# Patient Record
Sex: Male | Born: 1965 | Race: White | Hispanic: No | Marital: Married | Smoking: Never smoker
Health system: Southern US, Community
[De-identification: ages and names within clinical notes are randomized; demographics above are authoritative.]

## PROBLEM LIST (undated history)

## (undated) DIAGNOSIS — D689 Coagulation defect, unspecified: Secondary | ICD-10-CM

## (undated) DIAGNOSIS — K219 Gastro-esophageal reflux disease without esophagitis: Secondary | ICD-10-CM

## (undated) DIAGNOSIS — J309 Allergic rhinitis, unspecified: Secondary | ICD-10-CM

## (undated) DIAGNOSIS — E785 Hyperlipidemia, unspecified: Secondary | ICD-10-CM

## (undated) DIAGNOSIS — F32A Depression, unspecified: Secondary | ICD-10-CM

## (undated) DIAGNOSIS — T7840XA Allergy, unspecified, initial encounter: Secondary | ICD-10-CM

## (undated) DIAGNOSIS — F329 Major depressive disorder, single episode, unspecified: Secondary | ICD-10-CM

## (undated) HISTORY — PX: SPLENECTOMY: SUR1306

## (undated) HISTORY — DX: Allergy, unspecified, initial encounter: T78.40XA

## (undated) HISTORY — DX: Allergic rhinitis, unspecified: J30.9

## (undated) HISTORY — DX: Coagulation defect, unspecified: D68.9

## (undated) HISTORY — DX: Depression, unspecified: F32.A

## (undated) HISTORY — DX: Major depressive disorder, single episode, unspecified: F32.9

---

## 1999-02-28 ENCOUNTER — Other Ambulatory Visit: Admission: RE | Admit: 1999-02-28 | Discharge: 1999-02-28 | Payer: Self-pay | Admitting: Oncology

## 1999-03-14 ENCOUNTER — Ambulatory Visit (HOSPITAL_COMMUNITY): Admission: RE | Admit: 1999-03-14 | Discharge: 1999-03-14 | Payer: Self-pay | Admitting: Oncology

## 2005-12-04 ENCOUNTER — Encounter: Admission: RE | Admit: 2005-12-04 | Discharge: 2005-12-04 | Payer: Self-pay | Admitting: Occupational Medicine

## 2008-12-11 ENCOUNTER — Ambulatory Visit: Payer: Self-pay | Admitting: Cardiology

## 2008-12-13 ENCOUNTER — Inpatient Hospital Stay (HOSPITAL_BASED_OUTPATIENT_CLINIC_OR_DEPARTMENT_OTHER): Admission: RE | Admit: 2008-12-13 | Discharge: 2008-12-13 | Payer: Self-pay | Admitting: Cardiovascular Disease

## 2008-12-13 ENCOUNTER — Ambulatory Visit: Payer: Self-pay | Admitting: Cardiovascular Disease

## 2011-03-26 NOTE — Cardiovascular Report (Signed)
NAMERIGHTEOUS, CLAIBORNE NO.:  [id number]   MEDICAL RECORD NO.:  [id number]          PATIENT TYPE:  OIB   LOCATION:  1963                         FACILITY:  [facility]   PHYSICIAN:  Verne Carrow, MDDATE OF BIRTH:  02/14/1966   DATE OF PROCEDURE:  12/13/2008  DATE OF DISCHARGE:  12/13/2008                            CARDIAC CATHETERIZATION   PRIMARY CARDIOLOGIST:  Learta Codding, MD, [facility]   PROCEDURE PERFORMED:  1. Left heart catheterization.  2. Selective coronary angiography.  3. Left ventricular angiogram.   OPERATOR:  Verne Carrow, MD   INDICATIONS:  This is a 45 year old Caucasian male with a history of  hyperlipidemia, smokeless tobacco use, and a strong family history of  coronary artery disease was admitted to [facility] with  complaints of chest pain, palpitations, and lightheadedness.  He ruled  out for myocardial infarction with serial cardiac enzymes.  However, a  nuclear stress test performed on December 12, 2008, was suggestive of an  inferior wall defect and ventricular dilatation with exercise that could  be suggestive of multivessel balanced ischemia.  The patient was  referred for an outpatient left heart catheterization today.   DETAILS OF PROCEDURE:  The patient was brought into the outpatient  catheterization laboratory after signing informed consent for the  procedure.  The right groin was prepped and draped in sterile fashion.  Lidocaine 1% was used for local anesthesia.  A 4-French sheath was  inserted into the right femoral artery without difficulty.  A JL-4  diagnostic catheter was used to selectively engage and inject the left  coronary system.  A 3-DRC diagnostic catheter was used to selectively  engage and inject the right coronary artery.  A 4-French pigtail  catheter was used to cross the aortic valve into the left ventricle.  Following performance of the left ventricular angiogram, the pigtail  catheter was pulled  back across the aortic valve with no significant  pressure gradient measured.  The patient tolerated the procedure well.   HEMODYNAMIC FINDINGS:  Central aortic pressure 129/83.  Left ventricular  pressure 133/12.  End-diastolic pressure 18.   ANGIOGRAPHIC FINDINGS:  1. The left main coronary artery bifurcates into the circumflex and      LAD and has no angiographic evidence of disease.  2. Left anterior descending is a large vessel that courses to the apex      and gives off 2 diagonal branches.  There is no angiographic      evidence of disease in the LAD system.  3. Circumflex artery has a moderate-sized first obtuse marginal branch      and a small posterolateral branch that is more distal.  There is no      angiographic evidence of disease in the circumflex.  4. The right coronary artery is a large dominant vessel that gives off      a posterior descending artery and a posterolateral branch.  There      is no angiographic evidence of disease in the right coronary      artery.  5. Left ventricular angiogram demonstrates normal left ventricular  systolic function with no wall motion abnormalities.  Ejection      fraction is estimated at 55-60%.   IMPRESSION:  1. No angiographic evidence of coronary artery disease.  2. Normal left ventricular systolic function.   RECOMMENDATIONS:  I do not recommend any further cardiac workup of this  patient's chest pain at this time.  I would consider noncardiac  etiologies of his chest pain.  We will have the patient follow up with  Dr. Andee Lineman in 1 week.      Verne Carrow, MD  Electronically Signed     CM/MEDQ  D:  12/13/2008  T:  12/14/2008  Job:  [id number]   cc:   Learta Codding, MD,FACC

## 2011-05-14 ENCOUNTER — Ambulatory Visit (INDEPENDENT_AMBULATORY_CARE_PROVIDER_SITE_OTHER): Payer: BC Managed Care – PPO | Admitting: Internal Medicine

## 2011-05-14 DIAGNOSIS — R1310 Dysphagia, unspecified: Secondary | ICD-10-CM

## 2011-05-21 NOTE — Consult Note (Signed)
NAMEWILBERTH, Barry NO.:  [id number]  MEDICAL RECORD NO.:  [id number]  LOCATION:                                 FACILITY:  PHYSICIAN:  Lionel December, M.D.    DATE OF BIRTH:  Jan 09, 1966  DATE OF CONSULTATION:  05/13/2011 DATE OF DISCHARGE:                                CONSULTATION   REASON FOR CONSULTATION:  Dysphagia.  HISTORY OF PRESENT ILLNESS:  [name] is a 45 year old white male referred to our office by Dr. Garner Nash for solid food dysphagia.  Meagan tells me he has actually had symptoms for over 3 years.  He did undergo a swallow test years ago and it was normal.  He also recently underwent a barium swallow/barium pill study.  He says with swallowing there is slight impression on the posterior aspect of the cervical esophagus, anterior osteophytes at C5/C6 level.  When swallowing a bolus of food, this could be associated with sensation of dysphagia.  No obstruction was seen. There is slightly prominent cricopharyngeus muscle impression.  When the cervical esophagus is contracted, there appears to be a very minimal beginning of Zenker diverticulum formation.  This has not progressed significantly since the previous study.  Esophageal motility appeared normal.  No esophageal mass obstruction was seen.  No reflux was evident.  No stricture was seen.  When the patient swallows the 13-mm barium pill, the tablet passed through the esophagus into the stomach without evidence of stricture or area of significant narrowing.  Tarl tells me that meats and breads in particular will lodge.  Potatoes and [place] fries we will also lodge.  This dysphagia is occurring sporadically.  He tells me when the food lodges food he has to force the bolus to go down.  He will either have to contract his chest or vomit the bolus back up.  He says this is occurring on a weekly basis now.  He does tell me he eats a lot of spicy foods.  He is not on a PPI at this time.  He has,  however, been prescribed a PPI, but did not take it.  He also complains of feeling bloated and complains of feeling having a thick feeling in his upper abdomen.  He usually has 3-4 bowel movements a day.  They are formed.  They are not hard.  He occasionally sees some rectal bleeding, but no melena.  Three years ago, he states that he took a PPI, but it did not change his dysphagia.  He is allergic to IVIG HEMOGLOBIN.  MEDICATIONS: 1. Testosterone injections every 3 weeks. 2. Albuterol inhaler p.r.n. 3. Viagra 100 mg p.r.n. 4. Naproxen 500 mg p.r.n.  The naproxen is not on a daily basis. 5. Lipitor 20 mg a day.  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  He has slight asthma and high cholesterol.  FAMILY HISTORY:  His mother is alive in good health.  His father is alive with CAD and diabetes.  No sisters.  He has one brother in good health with hypertension.  He is single.  He works for an [employer].  He dips  1 can of snuff a day.  He is a moderate drinker and he has 3 children in good health.  OBJECTIVE:  VITAL SIGNS:  His height is 5 feet and 9-1/2 inches.  His weight is 205.8.  His blood pressure is 112/64.  His temp is 99.2.  His pulse is 76. HEENT:  He has natural teeth in good condition.  His oral mucosa is moist.  His conjunctivae are pink.  His sclerae are anicteric. NECK:  His thyroid normal.  There is no cervical lymphadenopathy. LUNGS:  Clear. HEART:  Regular rate and rhythm. ABDOMEN:  Soft.  Bowel sounds are positive.  I do not feel any masses and there is no tenderness. EXTREMITIES:  There is no edema to his extremities.  ASSESSMENT:  Barry Ross is a 45 year old male presenting today with complaints of solid food dysphagia.  It feels as the bolus is lodging in his midesophagus.  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  RECOMMENDATIONS:  I would like to schedule an EGD/ED with Dr. Karilyn Cota.  I am going to give him samples of Aciphex 6 boxes.  This will be for 18 days.  I would like for him to take it 30 minutes before breakfast.  The risks and benefits were reviewed with him and he is agreeable to proceed.  Thank you for allowing [place] to participate in the care of this very nice gentleman.    ______________________________ Dorene Ar, NP   ______________________________ Lionel December, M.D.    TS/MEDQ  D:  05/14/2011  T:  05/15/2011  Job:  [id number]  cc:   Dr. Garner Nash

## 2011-06-07 ENCOUNTER — Encounter (HOSPITAL_COMMUNITY)
Admission: RE | Disposition: A | Discharge status: Home / Self Care | Payer: Self-pay | Source: Ambulatory Visit | Attending: Internal Medicine

## 2011-06-07 ENCOUNTER — Other Ambulatory Visit (INDEPENDENT_AMBULATORY_CARE_PROVIDER_SITE_OTHER): Payer: Self-pay | Admitting: Internal Medicine

## 2011-06-07 ENCOUNTER — Encounter (HOSPITAL_COMMUNITY): Payer: Self-pay | Admitting: *Deleted

## 2011-06-07 ENCOUNTER — Ambulatory Visit (HOSPITAL_COMMUNITY)
Admission: RE | Admit: 2011-06-07 | Discharge: 2011-06-07 | Disposition: A | Discharge status: Home / Self Care | Payer: BC Managed Care – PPO | Source: Ambulatory Visit | Attending: Internal Medicine | Admitting: Internal Medicine

## 2011-06-07 ENCOUNTER — Encounter (INDEPENDENT_AMBULATORY_CARE_PROVIDER_SITE_OTHER): Payer: BC Managed Care – PPO | Admitting: Internal Medicine

## 2011-06-07 DIAGNOSIS — E785 Hyperlipidemia, unspecified: Secondary | ICD-10-CM | POA: Insufficient documentation

## 2011-06-07 DIAGNOSIS — K449 Diaphragmatic hernia without obstruction or gangrene: Secondary | ICD-10-CM | POA: Insufficient documentation

## 2011-06-07 DIAGNOSIS — K2970 Gastritis, unspecified, without bleeding: Secondary | ICD-10-CM | POA: Insufficient documentation

## 2011-06-07 DIAGNOSIS — R1310 Dysphagia, unspecified: Secondary | ICD-10-CM | POA: Insufficient documentation

## 2011-06-07 DIAGNOSIS — K208 Other esophagitis: Secondary | ICD-10-CM

## 2011-06-07 DIAGNOSIS — K2100 Gastro-esophageal reflux disease with esophagitis, without bleeding: Secondary | ICD-10-CM | POA: Insufficient documentation

## 2011-06-07 DIAGNOSIS — K2990 Gastroduodenitis, unspecified, without bleeding: Secondary | ICD-10-CM | POA: Insufficient documentation

## 2011-06-07 DIAGNOSIS — K222 Esophageal obstruction: Secondary | ICD-10-CM

## 2011-06-07 DIAGNOSIS — Z01812 Encounter for preprocedural laboratory examination: Secondary | ICD-10-CM | POA: Insufficient documentation

## 2011-06-07 HISTORY — DX: Hyperlipidemia, unspecified: E78.5

## 2011-06-07 HISTORY — PX: ESOPHAGOGASTRODUODENOSCOPY: SHX5428

## 2011-06-07 HISTORY — DX: Gastro-esophageal reflux disease without esophagitis: K21.9

## 2011-06-07 HISTORY — PX: BALLOON DILATION: SHX5330

## 2011-06-07 SURGERY — EGD (ESOPHAGOGASTRODUODENOSCOPY)
Anesthesia: Moderate Sedation | Site: Esophagus

## 2011-06-07 MED ORDER — PROMETHAZINE HCL 25 MG/ML IJ SOLN
INTRAMUSCULAR | Status: DC | PRN
  Administered 2011-06-07: 12.5 mg via INTRAVENOUS

## 2011-06-07 MED ORDER — MEPERIDINE HCL 50 MG/ML IJ SOLN
INTRAMUSCULAR | Status: AC
  Filled 2011-06-07: qty 1

## 2011-06-07 MED ORDER — PANTOPRAZOLE SODIUM 40 MG PO TBEC: 40.0000 mg | DELAYED_RELEASE_TABLET | Freq: Every day | ORAL | Status: AC

## 2011-06-07 MED ORDER — SODIUM CHLORIDE 0.9 % IJ SOLN
INTRAMUSCULAR | Status: AC
  Filled 2011-06-07: qty 10

## 2011-06-07 MED ORDER — BUTAMBEN-TETRACAINE-BENZOCAINE 2-2-14 % EX AERO
INHALATION_SPRAY | CUTANEOUS | Status: DC | PRN
  Administered 2011-06-07: 2 via TOPICAL

## 2011-06-07 MED ORDER — PROMETHAZINE HCL 25 MG/ML IJ SOLN
INTRAMUSCULAR | Status: AC
  Filled 2011-06-07: qty 1

## 2011-06-07 MED ORDER — MIDAZOLAM HCL 5 MG/5ML IJ SOLN
INTRAMUSCULAR | Status: DC | PRN
  Administered 2011-06-07: 3 mg via INTRAVENOUS
  Administered 2011-06-07: 2 mg via INTRAVENOUS
  Administered 2011-06-07: 3 mg via INTRAVENOUS
  Administered 2011-06-07: 2 mg via INTRAVENOUS

## 2011-06-07 MED ORDER — MIDAZOLAM HCL 5 MG/5ML IJ SOLN
INTRAMUSCULAR | Status: AC
  Filled 2011-06-07: qty 10

## 2011-06-07 MED ORDER — SODIUM CHLORIDE 0.45 % IV SOLN
INTRAVENOUS | Status: DC
  Administered 2011-06-07: 10:00:00 via INTRAVENOUS

## 2011-06-07 MED ORDER — MEPERIDINE HCL 25 MG/ML IJ SOLN
INTRAMUSCULAR | Status: DC | PRN
  Administered 2011-06-07 (×2): 25 mg via INTRAVENOUS

## 2011-06-07 NOTE — H&P (Signed)
  Job [id number]

## 2011-06-07 NOTE — H&P (Signed)
Barry Ross is an 46 y.o. male.   Chief Complaint: For EGD and ED HPI: Patient is a 45 year old Caucasian male who has had solid food dysphagia for the last 3 years. He's been experiencing this symptom frequently recently. He has not had problems with heartburn until very recently. He was recently given an antibiotic for bronchitis and developed rash has been taking Benadryl. He does not remember the name of antibiotics that he took.  Past Medical History  Diagnosis Date  . Hyperlipidemia   . Asthma   . GERD (gastroesophageal reflux disease)     Past Surgical History  Procedure Date  . Splenectomy     Secondary to ITP    History reviewed. No pertinent family history. Social History:  reports that he has never smoked. He quit smokeless tobacco use 5 days ago. His smokeless tobacco use included Snuff. He reports that he drinks about 7.8 ounces of alcohol per week. He reports that he does not use illicit drugs.  Allergies:  Allergies  Allergen Reactions  . Other     IV IG hemoglobin    Medications Prior to Admission  Medication Dose Route Frequency Provider Last Rate Last Dose  . 0.45 % sodium chloride infusion   Intravenous Continuous Malissa Hippo, MD 20 mL/hr at 06/07/11 1014    . meperidine (DEMEROL) 50 MG/ML injection           . midazolam (VERSED) 5 MG/5ML injection           . sodium chloride 0.9 % injection            Medications Prior to Admission  Medication Sig Dispense Refill  . ALBUTEROL IN Inhale into the lungs as needed.        . naproxen (NAPROSYN) 500 MG tablet Take 500 mg by mouth as needed.        . simvastatin (ZOCOR) 20 MG tablet Take 20 mg by mouth at bedtime.        Marland Kitchen testosterone cypionate (DEPOTESTOTERONE CYPIONATE) 100 MG/ML injection Inject 10 mg into the muscle every 21 ( twenty-one) days.          No results found for this or any previous visit (from the past 48 hour(s)). No results found.  Review of Systems  Gastrointestinal: Negative for  nausea, vomiting, abdominal pain, diarrhea, constipation, blood in stool and melena. Heartburn: last few days.    Blood pressure 165/103, pulse 75, temperature 98.6 F (37 C), temperature source Oral, resp. rate 20, height 5' 9.5" (1.765 m), weight 204 lb (92.534 kg), SpO2 98.00%. Physical Exam  Constitutional: He appears well-developed and well-nourished.  HENT:  Mouth/Throat: Oropharynx is clear and moist.  Eyes: Conjunctivae are normal.  Neck: No thyromegaly present.  Cardiovascular: Normal rate, regular rhythm and normal heart sounds.   No murmur heard. Respiratory: Breath sounds normal.  GI: Soft. He exhibits no distension. There is no tenderness.  Musculoskeletal: He exhibits no edema.  Lymphadenopathy:    He has no cervical adenopathy.  Neurological: He is alert.  Skin: Skin is warm and dry.     Assessment/Plan Solid food dysphagia. EGD with ED. Patient is agreeable to proceed with the procedure.  Ulysee Fyock U 06/07/2011, 10:24 AM

## 2011-06-07 NOTE — Brief Op Note (Signed)
06/07/2011  10:49 AM  PATIENT:  Barry Ross  45 y.o. male  PRE-OPERATIVE DIAGNOSIS:  dysphagia  POST-OPERATIVE DIAGNOSIS:  Antral Erosions  PROCEDURE:  Procedure(s): ESOPHAGOGASTRODUODENOSCOPY (EGD) MALONEY DILATION SAVORY DILATION  SURGEON:  Surgeon(s): Malissa Hippo, MD  Esophagogastroduodenoscopy with esophageal dilation. Esophageal mucosa reveals coarse appearance linear furrows and stricture at and just above the GE junction. Initially dilated with a scope and subsequently with a balloon to 15 mm. GEJ at 41 and hiatus at 43 cm. Multiple antral erosions.. Biopsy taken from esophageal body looking for eosinophilic esophagitis.

## 2011-06-07 NOTE — Op Note (Signed)
Barry Ross, Barry Ross               ACCOUNT NO.:  [id number]  MEDICAL RECORD NO.:  [id number]  LOCATION:  [facility]                          FACILITY:  [facility]  PHYSICIAN:  Lionel December, M.D.    DATE OF BIRTH:  [birth year]  DATE OF PROCEDURE:  06/07/2011 DATE OF DISCHARGE:  06/07/2011                              OPERATIVE REPORT   PROCEDURE:  Esophagogastroduodenoscopy with esophageal dilation.  INDICATIONS:  The patient is a 45 year old Caucasian male with 3-year history of intermittent solid food dysphagia occurring more frequently lately.  He also has been experiencing frequent heartburn over the last few weeks.  Procedures were reviewed with the patient.  Informed consent was obtained.  MEDS FOR CONSCIOUS SEDATION:  Cetacaine spray for pharyngeal topical anesthesia, Demerol 50 mg IV, Versed 10 mg IV, promethazine 12.5 mg IV in diluted form.  FINDINGS:  Procedure performed in endoscopy suite.  The patient's vital signs and O2 sat were monitored during the procedure and remained stable.  The patient was placed in left lateral decubitus position and Pentax videoscope was passed via oropharynx without any difficulty into esophagus.  Esophagus:  Esophageal mucosa was abnormal with coarse appearance, linear furrows and distal rings.  There was stricture involving GE junction and about a centimeter proximal to it.  This was initially dilated by passing the scope.  GE junction was located at 41 cm and hiatus at 43.  Stomach:  It was empty and distended very well with insufflation. Folds, proximal stomach are normal.  Examination of mucosa revealed multiple antral erosions.  Pyloric channel was patent.  Angularis, fundus and cardia were examined by retroflexing scope were normal.  Duodenum:  Normal bulbar and postbulbar mucosa.  A balloon dilator was passed through the scope.  Guidewire was pushed in the gastric lumen.  Balloon dilator was positioned across distal esophagus and  gradually insufflated diameter of 15 mm and maintained for few seconds until the balloon was then passed distally.  There was mucosal disruption involving distal 1 cm the lumen at GE junction.  On the way out esophageal biopsy was taken looking for eosinophilic esophagitis.  Endoscope was withdrawn.  The patient tolerated the procedure well.  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 Esophageal mucosal appearance consistent with eosinophilic esophagitis.  Small sliding hiatal hernia.  Erosive antral gastritis.  Esophageal stricture dilated/disrupted with balloon up to 15 mm.  RECOMMENDATIONS:  Antireflux measures.  We will check his H. pylori serologies today.  Pantoprazole 30 mg p.o. q.a.m. prescription given for 30 with 5 refills.  I will be contacting the patient results pending studies and further recommendations.          ______________________________ Lionel December, M.D.     NR/MEDQ  D:  06/07/2011  T:  06/07/2011  Job:  [id number]  cc:   Donzetta Sprung, MD Fax: [phone]

## 2011-06-14 ENCOUNTER — Encounter (HOSPITAL_COMMUNITY): Payer: Self-pay | Admitting: Internal Medicine

## 2011-06-20 NOTE — Progress Notes (Signed)
PATIENT ON RECALL 3 [facility] KR

## 2011-07-12 DIAGNOSIS — J019 Acute sinusitis, unspecified: Secondary | ICD-10-CM | POA: Insufficient documentation

## 2011-08-09 DIAGNOSIS — B360 Pityriasis versicolor: Secondary | ICD-10-CM | POA: Insufficient documentation

## 2011-08-13 DIAGNOSIS — H1045 Other chronic allergic conjunctivitis: Secondary | ICD-10-CM | POA: Insufficient documentation

## 2011-09-09 ENCOUNTER — Encounter (INDEPENDENT_AMBULATORY_CARE_PROVIDER_SITE_OTHER): Payer: Self-pay | Admitting: *Deleted

## 2011-09-18 ENCOUNTER — Ambulatory Visit (INDEPENDENT_AMBULATORY_CARE_PROVIDER_SITE_OTHER): Payer: BC Managed Care – PPO | Admitting: Internal Medicine

## 2012-03-27 DIAGNOSIS — J45902 Unspecified asthma with status asthmaticus: Secondary | ICD-10-CM | POA: Insufficient documentation

## 2013-08-25 DIAGNOSIS — L299 Pruritus, unspecified: Secondary | ICD-10-CM | POA: Insufficient documentation

## 2013-11-11 HISTORY — PX: OTHER SURGICAL HISTORY: SHX169

## 2013-12-27 DIAGNOSIS — Z1331 Encounter for screening for depression: Secondary | ICD-10-CM | POA: Insufficient documentation

## 2013-12-27 DIAGNOSIS — R059 Cough, unspecified: Secondary | ICD-10-CM | POA: Insufficient documentation

## 2015-03-30 DIAGNOSIS — L03119 Cellulitis of unspecified part of limb: Secondary | ICD-10-CM | POA: Insufficient documentation

## 2015-03-30 DIAGNOSIS — E669 Obesity, unspecified: Secondary | ICD-10-CM | POA: Insufficient documentation

## 2015-06-29 DIAGNOSIS — I83219 Varicose veins of right lower extremity with both ulcer of unspecified site and inflammation: Secondary | ICD-10-CM | POA: Insufficient documentation

## 2015-07-12 DIAGNOSIS — Z9089 Acquired absence of other organs: Secondary | ICD-10-CM | POA: Insufficient documentation

## 2015-07-12 DIAGNOSIS — Z23 Encounter for immunization: Secondary | ICD-10-CM | POA: Insufficient documentation

## 2015-07-13 DIAGNOSIS — N529 Male erectile dysfunction, unspecified: Secondary | ICD-10-CM | POA: Insufficient documentation

## 2015-10-09 DIAGNOSIS — M722 Plantar fascial fibromatosis: Secondary | ICD-10-CM | POA: Insufficient documentation

## 2016-01-18 ENCOUNTER — Encounter: Payer: Self-pay | Admitting: Internal Medicine

## 2016-03-11 ENCOUNTER — Ambulatory Visit (INDEPENDENT_AMBULATORY_CARE_PROVIDER_SITE_OTHER): Payer: BLUE CROSS/BLUE SHIELD | Admitting: Internal Medicine

## 2016-03-11 ENCOUNTER — Encounter: Payer: Self-pay | Admitting: Internal Medicine

## 2016-03-11 VITALS — BP 140/80 | HR 78 | Ht 69.5 in | Wt 222.3 lb

## 2016-03-11 DIAGNOSIS — K2100 Gastro-esophageal reflux disease with esophagitis, without bleeding: Secondary | ICD-10-CM

## 2016-03-11 DIAGNOSIS — K222 Esophageal obstruction: Secondary | ICD-10-CM | POA: Diagnosis not present

## 2016-03-11 DIAGNOSIS — R140 Abdominal distension (gaseous): Secondary | ICD-10-CM | POA: Diagnosis not present

## 2016-03-11 DIAGNOSIS — K625 Hemorrhage of anus and rectum: Secondary | ICD-10-CM

## 2016-03-11 MED ORDER — NA SULFATE-K SULFATE-MG SULF 17.5-3.13-1.6 GM/177ML PO SOLN: 1.0000 | Freq: Once | ORAL | Status: DC

## 2016-03-11 NOTE — Patient Instructions (Signed)

## 2016-03-11 NOTE — Progress Notes (Signed)
HISTORY OF PRESENT ILLNESS:  Barry Ross is a 50 y.o. male with a history of ITP status post splenectomy, and chronic GERD complicated by peptic stricture for which she required esophageal dilation elsewhere. He is self referred today regarding chronic bloating and rectal bleeding. The patient reports 18 month history of significant abdominal bloating exacerbated by meals. Over this time he has gained 20 pounds. He tells me that he is undergone workup from his PCP including blood work, ultrasound, and upper GI series. He states these were unrevealing. He states he was tested for celiac disease which was negative. Patient does have a history of chronic GERD. He was having significant problems with dysphagia in 2012 and underwent upper endoscopy with dilation. At that time he was placed on PPI. Since that time he has had no reflux symptoms or recurrent dysphagia. Next, he reports having had significant rectal bleeding a proximal lead 5-6 weeks ago. This lasted for several days. He was taking Naprosyn at that time. No family history of colon cancer to the best of his knowledge. He denies change in bowel habits or abdominal pain  REVIEW OF SYSTEMS:  All non-GI ROS negative except for sinus and allergy, night sweats  Past Medical History  Diagnosis Date  . Hyperlipidemia   . Asthma   . GERD (gastroesophageal reflux disease)   . Allergic rhinitis   . Depression     Past Surgical History  Procedure Laterality Date  . Splenectomy      Secondary to ITP  . Esophagogastroduodenoscopy  06/07/2011    Procedure: ESOPHAGOGASTRODUODENOSCOPY (EGD);  Surgeon: Rogene Houston, MD;  Location: [facility];  Service: Endoscopy;  Laterality: N/A;  . Balloon dilation  06/07/2011    Procedure: BALLOON DILATION;  Surgeon: Rogene Houston, MD;  Location: [facility];  Service: Endoscopy;  Laterality: N/A;    Social History Barry Ross  reports that he has never smoked. He quit smokeless tobacco use about  4 years ago. His smokeless tobacco use included Snuff. He reports that he drinks about 7.8 oz of alcohol per week. He reports that he does not use illicit drugs.  family history is not on file.  Allergies  Allergen Reactions  . Other     IV IG hemoglobin       PHYSICAL EXAMINATION:  Vital signs: BP 140/80 mmHg  Pulse 78  Ht 5' 9.5" (1.765 m)  Wt 222 lb 4.8 oz (100.835 kg)  BMI 32.37 kg/m2  Constitutional: generally well-appearing, no acute distress Psychiatric: alert and oriented x3, cooperative Eyes: extraocular movements intact, anicteric, conjunctiva pink Mouth: oral pharynx moist, no lesions Neck: supple no lymphadenopathy Cardiovascular: heart regular rate and rhythm, no murmur Lungs: clear to auscultation bilaterally Abdomen: soft,Obese, nontender, nondistended, no obvious ascites, no peritoneal signs, normal bowel sounds, no organomegaly Rectal:Deferred until colonoscopy Extremities: no lower extremity edema bilaterally Skin: no lesions on visible extremities Neuro: No focal deficits. Cranial nerves intact  ASSESSMENT:  #1. Chronic abdominal bloating. No alarm features. Suspect truncal obesity contributing #2. Obesity #3. Chronic GERD, Peptic stricture. Asymptomatic post dilation on PPI #4. Rectal bleeding as described. Rule out neoplasia   PLAN:  1. Reflux precautions with attention to weight loss 2. Schedule colonoscopy to evaluate rectal bleeding.The nature of the procedure, as well as the risks, benefits, and alternatives were carefully and thoroughly reviewed with the patient. Ample time for discussion and questions allowed. The patient understood, was satisfied, and agreed to proceed. 3. Continue PPI  4.  Repeat upper endoscopy with esophageal dilation if the patient were to develop recurrent dysphagia

## 2016-05-21 ENCOUNTER — Encounter: Payer: BLUE CROSS/BLUE SHIELD | Admitting: Internal Medicine

## 2016-08-23 DIAGNOSIS — I10 Essential (primary) hypertension: Secondary | ICD-10-CM | POA: Diagnosis not present

## 2016-08-26 DIAGNOSIS — I10 Essential (primary) hypertension: Secondary | ICD-10-CM | POA: Diagnosis not present

## 2016-08-26 DIAGNOSIS — R079 Chest pain, unspecified: Secondary | ICD-10-CM | POA: Diagnosis not present

## 2016-08-26 DIAGNOSIS — Z79899 Other long term (current) drug therapy: Secondary | ICD-10-CM | POA: Diagnosis not present

## 2016-08-26 DIAGNOSIS — Z888 Allergy status to other drugs, medicaments and biological substances status: Secondary | ICD-10-CM | POA: Diagnosis not present

## 2016-08-26 DIAGNOSIS — Z23 Encounter for immunization: Secondary | ICD-10-CM | POA: Diagnosis not present

## 2016-08-26 DIAGNOSIS — R0789 Other chest pain: Secondary | ICD-10-CM | POA: Diagnosis not present

## 2016-09-08 DIAGNOSIS — E8881 Metabolic syndrome: Secondary | ICD-10-CM | POA: Insufficient documentation

## 2016-09-08 DIAGNOSIS — J45909 Unspecified asthma, uncomplicated: Secondary | ICD-10-CM | POA: Insufficient documentation

## 2016-09-08 DIAGNOSIS — Z9189 Other specified personal risk factors, not elsewhere classified: Secondary | ICD-10-CM | POA: Insufficient documentation

## 2016-09-08 DIAGNOSIS — R7301 Impaired fasting glucose: Secondary | ICD-10-CM | POA: Insufficient documentation

## 2016-09-09 DIAGNOSIS — E782 Mixed hyperlipidemia: Secondary | ICD-10-CM | POA: Diagnosis not present

## 2016-09-09 DIAGNOSIS — E8881 Metabolic syndrome: Secondary | ICD-10-CM | POA: Diagnosis not present

## 2016-09-09 DIAGNOSIS — Z1389 Encounter for screening for other disorder: Secondary | ICD-10-CM | POA: Diagnosis not present

## 2016-09-09 DIAGNOSIS — J4520 Mild intermittent asthma, uncomplicated: Secondary | ICD-10-CM | POA: Diagnosis not present

## 2016-09-09 DIAGNOSIS — E785 Hyperlipidemia, unspecified: Secondary | ICD-10-CM | POA: Insufficient documentation

## 2016-09-09 DIAGNOSIS — R7301 Impaired fasting glucose: Secondary | ICD-10-CM | POA: Diagnosis not present

## 2016-10-01 ENCOUNTER — Encounter: Payer: Self-pay | Admitting: *Deleted

## 2016-10-01 ENCOUNTER — Encounter: Payer: Self-pay | Admitting: Cardiology

## 2016-10-01 ENCOUNTER — Ambulatory Visit (INDEPENDENT_AMBULATORY_CARE_PROVIDER_SITE_OTHER): Payer: BLUE CROSS/BLUE SHIELD | Admitting: Cardiology

## 2016-10-01 VITALS — BP 159/88 | HR 76 | Ht 69.0 in | Wt 217.2 lb

## 2016-10-01 DIAGNOSIS — R079 Chest pain, unspecified: Secondary | ICD-10-CM | POA: Diagnosis not present

## 2016-10-01 DIAGNOSIS — R030 Elevated blood-pressure reading, without diagnosis of hypertension: Secondary | ICD-10-CM

## 2016-10-01 NOTE — Patient Instructions (Signed)
Your physician recommends that you schedule a follow-up appointment in: [place] DR. Starke   Your physician recommends that you continue on your current medications as directed. Please refer to the Current Medication list given to you today.  Your physician has requested that you have a lexiscan myoview. For further information please visit [email or web address]. Please follow instruction sheet, as given.  Your physician has requested that you regularly monitor and record your blood pressure readings at home FOR 2 WEEKS AND CALL us WITH READINGS. Please use the same machine at the same time of day to check your readings and record them to bring to your follow-up visit.  Thank you for choosing [facility]!!

## 2016-10-01 NOTE — Progress Notes (Signed)
Clinical Summary Barry Ross is a 50 y.o.male seen as a new consult, referred by Dr Barry Ross.   1. Chest pain - cath 12/2008 without significant disease.  -08/2016 exercise stress: exercised 8 min, HTN response 213/80, lateral ST depression lateral leads 2 mm and chest pain during test. PVCs and short run of PAT during exercise.  - episode of high bp's a few weeks ago. Seen at urgent care, normal EKG. BPs 170s/90s. Had started taking several nutritional supplements around this time.  - a few days later had some SOB and chest pressure, went to ER at [facility]. Had stress test done. Mild pressure midchest, not positional. Better with NG in ER. Lasted about 2-3 hours.  CAD risk factors: paternal grandfather died MI 79, paternal grandmother CABG, father CABG 54. Mother with CHF.     2. Elevated blood pressure - recent episodes of high bp's. He reports he recently starting taking heavy nutritional supplements to enhance his workouts including testosterone, and thinks could be related. Has never been diagnosed with HTN   Past Medical History:  Diagnosis Date  . Allergic rhinitis   . Asthma   . Depression   . GERD (gastroesophageal reflux disease)   . Hyperlipidemia      Allergies  Allergen Reactions  . Other     IV IG hemoglobin     Current Outpatient Prescriptions  Medication Sig Dispense Refill  . albuterol (PROVENTIL HFA;VENTOLIN HFA) 108 (90 Base) MCG/ACT inhaler Inhale 2 puffs into the lungs every 6 (six) hours as needed for wheezing or shortness of breath.    . ALBUTEROL IN Inhale into the lungs as needed.      . Na Sulfate-K Sulfate-Mg Sulf SOLN Take 1 kit by mouth once. 354 mL 0  . Na Sulfate-K Sulfate-Mg Sulf SOLN Take 1 kit by mouth once. 354 mL 0  . naproxen (NAPROSYN) 500 MG tablet Take 500 mg by mouth as needed.      . pantoprazole (PROTONIX) 40 MG tablet Take 1 tablet (40 mg total) by mouth daily. 30 tablet 5  . tadalafil (CIALIS) 20 MG tablet Take 20 mg by mouth  daily as needed for erectile dysfunction.     No current facility-administered medications for this visit.      Past Surgical History:  Procedure Laterality Date  . BALLOON DILATION  06/07/2011   Procedure: BALLOON DILATION;  Surgeon: Rogene Houston, MD;  Location: [facility];  Service: Endoscopy;  Laterality: N/A;  . ESOPHAGOGASTRODUODENOSCOPY  06/07/2011   Procedure: ESOPHAGOGASTRODUODENOSCOPY (EGD);  Surgeon: Rogene Houston, MD;  Location: [facility];  Service: Endoscopy;  Laterality: N/A;  . 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 Other     IV IG hemoglobin      Family History  Problem Relation Age of Onset  . Diabetes    . Heart disease    . Kidney disease       Social History Barry Ross reports that he has never smoked. He quit smokeless tobacco use about 5 years ago. His smokeless tobacco use included Snuff. Barry Ross reports that he drinks about 7.8 oz of alcohol per week .   Review of Systems CONSTITUTIONAL: No weight loss, fever, chills, weakness or fatigue.  HEENT: Eyes: No visual loss, blurred vision, double vision or yellow sclerae.No hearing loss, sneezing, congestion, runny nose or sore throat.  SKIN: No rash or itching.  CARDIOVASCULAR: per  HPI RESPIRATORY: No shortness of breath, cough or sputum.  GASTROINTESTINAL: No anorexia, nausea, vomiting or diarrhea. No abdominal pain or blood.  GENITOURINARY: No burning on urination, no polyuria NEUROLOGICAL: No headache, dizziness, syncope, paralysis, ataxia, numbness or tingling in the extremities. No change in bowel or bladder control.  MUSCULOSKELETAL: No muscle, back pain, joint pain or stiffness.  LYMPHATICS: No enlarged nodes. No history of splenectomy.  PSYCHIATRIC: No history of depression or anxiety.  ENDOCRINOLOGIC: No reports of sweating, cold or heat intolerance. No polyuria or polydipsia.  Marland Kitchen   Physical Examination Vitals:   10/01/16 1433  BP: (!)  159/88  Pulse: 76   Vitals:   10/01/16 1433  Weight: 217 lb 3.2 oz (98.5 kg)  Height: 5' 9" (1.753 m)    Gen: resting comfortably, no acute distress HEENT: no scleral icterus, pupils equal round and reactive, no palptable cervical adenopathy,  CV: RRR, no m/r/g, no jvd Resp: Clear to auscultation bilaterally GI: abdomen is soft, non-tender, non-distended, normal bowel sounds, no hepatosplenomegaly MSK: extremities are warm, no edema.  Skin: warm, no rash Neuro:  no focal deficits Psych: appropriate affect   Diagnostic Studies 12/2008 cath HEMODYNAMIC FINDINGS:  Central aortic pressure 129/83.  Left ventricular  pressure 133/12.  End-diastolic pressure 18.   ANGIOGRAPHIC FINDINGS:  1. The left main coronary artery bifurcates into the circumflex and      LAD and has no angiographic evidence of disease.  2. Left anterior descending is a large vessel that courses to the apex      and gives off 2 diagonal branches.  There is no angiographic      evidence of disease in the LAD system.  3. Circumflex artery has a moderate-sized first obtuse marginal branch      and a small posterolateral branch that is more distal.  There is no      angiographic evidence of disease in the circumflex.  4. The right coronary artery is a large dominant vessel that gives off      a posterior descending artery and a posterolateral branch.  There      is no angiographic evidence of disease in the right coronary      artery.  5. Left ventricular angiogram demonstrates normal left ventricular      systolic function with no wall motion abnormalities.  Ejection      fraction is estimated at 55-60%.   IMPRESSION:  1. No angiographic evidence of coronary artery disease.  2. Normal left ventricular systolic function.   2010 POST CATH RECOMMENDATIONS:  I do not recommend any further cardiac workup of this  patient's chest pain at this time.  I would consider noncardiac  etiologies of his chest pain.  We  will have the patient follow up with  Barry Ross in 1 week.    Assessment and Plan  1. Chest pain - unclear etiology. Exercise stress showed ST depressions in the setting of severe HTN response, unclear if ischemic or due to HTN response. Symptoms fairly atypical, he remains very physically active without significant limitations. He has multipe CAD risk factors including FH of early CAD - we will obtain nuclear stress imaging to further evaluate  2. Elevated blood pressure - unclear if related to recent nutritional supplements or trully has HTN - advised to stop supplements, monitor bp x 2 weeks and submit bp log. Penidng results may require HTN meds   F/u pending test results      Arnoldo Lenis, M.D.

## 2016-10-02 ENCOUNTER — Inpatient Hospital Stay (HOSPITAL_COMMUNITY): Admission: RE | Admit: 2016-10-02 | Payer: BLUE CROSS/BLUE SHIELD | Source: Ambulatory Visit

## 2016-10-02 ENCOUNTER — Encounter (HOSPITAL_COMMUNITY)
Admission: RE | Admit: 2016-10-02 | Discharge: 2016-10-02 | Disposition: A | Discharge status: Home / Self Care | Payer: BLUE CROSS/BLUE SHIELD | Source: Ambulatory Visit | Attending: Cardiology | Admitting: Cardiology

## 2016-10-02 ENCOUNTER — Encounter (HOSPITAL_COMMUNITY): Payer: Self-pay

## 2016-10-02 ENCOUNTER — Telehealth: Payer: Self-pay | Admitting: *Deleted

## 2016-10-02 DIAGNOSIS — R079 Chest pain, unspecified: Secondary | ICD-10-CM | POA: Insufficient documentation

## 2016-10-02 LAB — NM MYOCAR MULTI W/SPECT W/WALL MOTION / EF
CSEPED: 10 min
Estimated workload: 13.7 METS
Exercise duration (sec): 31 s
LHR: 0.46
LV sys vol: 68 mL
LVDIAVOL: 147 mL (ref 62–150)
MPHR: 171 {beats}/min
NUC STRESS TID: 1.07
Peak HR: 157 {beats}/min
Percent HR: 91 %
RPE: 13
Rest HR: 71 {beats}/min
SDS: 0
SRS: 1
SSS: 1

## 2016-10-02 MED ORDER — TECHNETIUM TC 99M TETROFOSMIN IV KIT
30.0000 | PACK | Freq: Once | INTRAVENOUS | Status: AC | PRN
  Administered 2016-10-02: 30 via INTRAVENOUS

## 2016-10-02 MED ORDER — TECHNETIUM TC 99M TETROFOSMIN IV KIT
10.0000 | PACK | Freq: Once | INTRAVENOUS | Status: AC | PRN
  Administered 2016-10-02: 9.9 via INTRAVENOUS

## 2016-10-02 MED ORDER — SODIUM CHLORIDE 0.9% FLUSH
INTRAVENOUS | Status: AC
  Administered 2016-10-02: 10 mL via INTRAVENOUS
  Filled 2016-10-02: qty 10

## 2016-10-02 MED ORDER — REGADENOSON 0.4 MG/5ML IV SOLN
INTRAVENOUS | Status: AC
  Filled 2016-10-02: qty 5

## 2016-10-02 NOTE — Telephone Encounter (Signed)
Pt wife made aware - LM on pt VM - routed to pcp

## 2016-10-02 NOTE — Telephone Encounter (Signed)
-----   Message from Arnoldo Lenis, MD sent at 10/02/2016  4:55 PM EST ----- Normal stress test. No evidence of blockages. Ok to resume exercising. He should update [place] on his bp's in a few weeks   Zandra Abts MD

## 2016-10-11 DIAGNOSIS — H5203 Hypermetropia, bilateral: Secondary | ICD-10-CM | POA: Diagnosis not present

## 2016-10-24 ENCOUNTER — Telehealth: Payer: Self-pay | Admitting: *Deleted

## 2016-10-24 NOTE — Telephone Encounter (Signed)
Pt was scheduled for 1 month f/u 12/14 - says he wants to cx appt and  f/u with pcp since stress test was normal - routed to Dr. Francine Graven

## 2016-10-24 NOTE — Telephone Encounter (Signed)
Ok to cancel appt, can f/u with [place] as needed  Zandra Abts MD

## 2016-10-29 ENCOUNTER — Ambulatory Visit: Payer: BLUE CROSS/BLUE SHIELD | Admitting: Cardiology

## 2017-03-17 DIAGNOSIS — S81812D Laceration without foreign body, left lower leg, subsequent encounter: Secondary | ICD-10-CM | POA: Diagnosis not present

## 2017-05-08 DIAGNOSIS — Z0001 Encounter for general adult medical examination with abnormal findings: Secondary | ICD-10-CM | POA: Diagnosis not present

## 2017-05-16 DIAGNOSIS — R7301 Impaired fasting glucose: Secondary | ICD-10-CM | POA: Diagnosis not present

## 2017-05-16 DIAGNOSIS — Z0001 Encounter for general adult medical examination with abnormal findings: Secondary | ICD-10-CM | POA: Diagnosis not present

## 2017-05-16 DIAGNOSIS — J4520 Mild intermittent asthma, uncomplicated: Secondary | ICD-10-CM | POA: Diagnosis not present

## 2017-05-16 DIAGNOSIS — E8881 Metabolic syndrome: Secondary | ICD-10-CM | POA: Diagnosis not present

## 2017-05-16 DIAGNOSIS — E782 Mixed hyperlipidemia: Secondary | ICD-10-CM | POA: Diagnosis not present

## 2017-05-20 ENCOUNTER — Encounter: Payer: Self-pay | Admitting: Internal Medicine

## 2017-06-12 ENCOUNTER — Encounter: Payer: Self-pay | Admitting: Internal Medicine

## 2017-07-22 DIAGNOSIS — R002 Palpitations: Secondary | ICD-10-CM | POA: Diagnosis not present

## 2017-07-30 ENCOUNTER — Ambulatory Visit (AMBULATORY_SURGERY_CENTER): Payer: Self-pay

## 2017-07-30 VITALS — Ht 69.0 in | Wt 211.4 lb

## 2017-07-30 DIAGNOSIS — Z1211 Encounter for screening for malignant neoplasm of colon: Secondary | ICD-10-CM

## 2017-07-30 MED ORDER — NA SULFATE-K SULFATE-MG SULF 17.5-3.13-1.6 GM/177ML PO SOLN: 1.0000 | Freq: Once | ORAL | 0 refills | Status: AC

## 2017-07-30 NOTE — Progress Notes (Signed)
Denies allergies to eggs or soy products. Denies complication of anesthesia or sedation. Denies use of weight loss medication. Denies use of O2.   Emmi instructions declined.  

## 2017-08-05 ENCOUNTER — Encounter: Payer: BLUE CROSS/BLUE SHIELD | Admitting: Internal Medicine

## 2017-08-13 ENCOUNTER — Encounter: Payer: Self-pay | Admitting: Internal Medicine

## 2017-08-25 ENCOUNTER — Ambulatory Visit (AMBULATORY_SURGERY_CENTER): Payer: BLUE CROSS/BLUE SHIELD | Admitting: Internal Medicine

## 2017-08-25 ENCOUNTER — Encounter: Payer: Self-pay | Admitting: Internal Medicine

## 2017-08-25 VITALS — BP 125/76 | HR 43 | Temp 98.4°F | Resp 18 | Ht 69.0 in | Wt 211.0 lb

## 2017-08-25 DIAGNOSIS — Z1211 Encounter for screening for malignant neoplasm of colon: Secondary | ICD-10-CM | POA: Diagnosis not present

## 2017-08-25 DIAGNOSIS — K621 Rectal polyp: Secondary | ICD-10-CM

## 2017-08-25 DIAGNOSIS — D128 Benign neoplasm of rectum: Secondary | ICD-10-CM

## 2017-08-25 DIAGNOSIS — K635 Polyp of colon: Secondary | ICD-10-CM

## 2017-08-25 DIAGNOSIS — D122 Benign neoplasm of ascending colon: Secondary | ICD-10-CM

## 2017-08-25 MED ORDER — SODIUM CHLORIDE 0.9 % IV SOLN: 500.0000 mL | INTRAVENOUS | Status: DC

## 2017-08-25 NOTE — Progress Notes (Signed)
Report to PACU, RN, vss, BBS= Clear.  

## 2017-08-25 NOTE — Progress Notes (Signed)
Called to room to assist during endoscopic procedure.  Patient ID and intended procedure confirmed with present staff. Received instructions for my participation in the procedure from the performing physician.  

## 2017-08-25 NOTE — Patient Instructions (Signed)
YOU HAD AN ENDOSCOPIC PROCEDURE TODAY AT THE [facility]:   Refer to the procedure report that was given to you for any specific questions about what was found during the examination.  If the procedure report does not answer your questions, please call your gastroenterologist to clarify.  If you requested that your care partner not be given the details of your procedure findings, then the procedure report has been included in a sealed envelope for you to review at your convenience later.  YOU SHOULD EXPECT: Some feelings of bloating in the abdomen. Passage of more gas than usual.  Walking can help get rid of the air that was put into your GI tract during the procedure and reduce the bloating. If you had a lower endoscopy (such as a colonoscopy or flexible sigmoidoscopy) you may notice spotting of blood in your stool or on the toilet paper. If you underwent a bowel prep for your procedure, you may not have a normal bowel movement for a few days.  Please Note:  You might notice some irritation and congestion in your nose or some drainage.  This is from the oxygen used during your procedure.  There is no need for concern and it should clear up in a day or so.  SYMPTOMS TO REPORT IMMEDIATELY:   Following lower endoscopy (colonoscopy or flexible sigmoidoscopy):  Excessive amounts of blood in the stool  Significant tenderness or worsening of abdominal pains  Swelling of the abdomen that is new, acute  Fever of 100F or higher   For urgent or emergent issues, a gastroenterologist can be reached at any hour by calling [phone].   DIET:  We do recommend a small meal at first, but then you may proceed to your regular diet.  Drink plenty of fluids but you should avoid alcoholic beverages for 24 hours. Try to increase the fiber in your diet, and drink plenty of water.  ACTIVITY:  You should plan to take it easy for the rest of today and you should NOT DRIVE or use heavy machinery until  tomorrow (because of the sedation medicines used during the test).    FOLLOW UP: Our staff will call the number listed on your records the next business day following your procedure to check on you and address any questions or concerns that you may have regarding the information given to you following your procedure. If we do not reach you, we will leave a message.  However, if you are feeling well and you are not experiencing any problems, there is no need to return our call.  We will assume that you have returned to your regular daily activities without incident.  If any biopsies were taken you will be contacted by phone or by letter within the next 1-3 weeks.  Please call us at [phone] if you have not heard about the biopsies in 3 weeks.    SIGNATURES/CONFIDENTIALITY: You and/or your care partner have signed paperwork which will be entered into your electronic medical record.  These signatures attest to the fact that that the information above on your After Visit Summary has been reviewed and is understood.  Full responsibility of the confidentiality of this discharge information lies with you and/or your care-partner.  Read all of the handouts given to you by your recovery room nurse. 

## 2017-08-25 NOTE — Op Note (Signed)
[facility] Patient Name: Barry Ross Procedure Date: 08/25/2017 11:16 AM MRN: [id number] Endoscopist: Docia Chuck. Henrene Pastor , MD Age: 51 Referring MD:  Date of Birth: 12-24-1965 Gender: Male Account #: [id number] Procedure:                Colonoscopy, with cold snare polypectomy x 2 Indications:              Screening for colorectal malignant neoplasm Medicines:                Monitored Anesthesia Care Procedure:                Pre-Anesthesia Assessment:                           - Prior to the procedure, a History and Physical                            was performed, and patient medications and                            allergies were reviewed. The patient's tolerance of                            previous anesthesia was also reviewed. The risks                            and benefits of the procedure and the sedation                            options and risks were discussed with the patient.                            All questions were answered, and informed consent                            was obtained. Prior Anticoagulants: The patient has                            taken no previous anticoagulant or antiplatelet                            agents. ASA Grade Assessment: II - A patient with                            mild systemic disease. After reviewing the risks                            and benefits, the patient was deemed in                            satisfactory condition to undergo the procedure.                           After obtaining informed consent, the colonoscope  was passed under direct vision. Throughout the                            procedure, the patient's blood pressure, pulse, and                            oxygen saturations were monitored continuously. The                            Colonoscope was introduced through the anus and                            advanced to the the cecum, identified by       appendiceal orifice and ileocecal valve. The                            ileocecal valve, appendiceal orifice, and rectum                            were photographed. The quality of the bowel                            preparation was excellent. The colonoscopy was                            performed without difficulty. The patient tolerated                            the procedure well. The bowel preparation used was                            SUPREP. Scope In: 11:29:18 AM Scope Out: 11:43:53 AM Scope Withdrawal Time: 0 hours 12 minutes 33 seconds  Total Procedure Duration: 0 hours 14 minutes 35 seconds  Findings:                 Two polyps were found in the rectum and ascending                            colon. The polyps were 3 to 4 mm in size. These                            polyps were removed with a cold snare. Resection                            and retrieval were complete.                           Multiple diverticula were found in the left colon                            and right colon.                           Internal hemorrhoids  were found during retroflexion.                           The exam was otherwise without abnormality on                            direct and retroflexion views. Complications:            No immediate complications. Estimated blood loss:                            None. Estimated Blood Loss:     Estimated blood loss: none. Impression:               - Two 3 to 4 mm polyps in the rectum and in the                            ascending colon, removed with a cold snare.                            Resected and retrieved.                           - Diverticulosis in the left colon and in the right                            colon.                           - Internal hemorrhoids.                           - The examination was otherwise normal on direct                            and retroflexion views. Recommendation:           - Repeat  colonoscopy in 5-10 years for surveillance.                           - Patient has a contact number available for                            emergencies. The signs and symptoms of potential                            delayed complications were discussed with the                            patient. Return to normal activities tomorrow.                            Written discharge instructions were provided to the                            patient.                           -  Resume previous diet.                           - Continue present medications.                           - Await pathology results. Docia Chuck. Henrene Pastor, MD 08/25/2017 11:47:52 AM This report has been signed electronically.

## 2017-08-26 ENCOUNTER — Telehealth: Payer: Self-pay | Admitting: *Deleted

## 2017-08-26 NOTE — Telephone Encounter (Signed)
  Follow up Call-  Call back number 08/25/2017  Post procedure Call Back phone  # [phone]  Permission to leave phone message Yes  Some recent data might be hidden     Patient questions:  Do you have a fever, pain , or abdominal swelling? No. Pain Score  0 *  Have you tolerated food without any problems? Yes.    Have you been able to return to your normal activities? Yes.    Do you have any questions about your discharge instructions: Diet   No. Medications  No. Follow up visit  No.  Do you have questions or concerns about your Care? No.  Actions: * If pain score is 4 or above: No action needed, pain <4.

## 2017-08-28 ENCOUNTER — Encounter: Payer: Self-pay | Admitting: Internal Medicine

## 2017-08-29 IMAGING — NM NM MYOCAR MULTI W/SPECT W/WALL MOTION & EF
2 series · 12 of 12 positions shown · non-contrast
Comparison: none

[Series 1: rest · 8.28mm/px · 6 of 64 frames shown]
[frame 6/64]
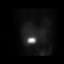
[frame 16/64]
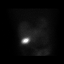
[frame 27/64]
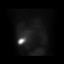
[frame 38/64]
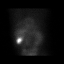
[frame 48/64]
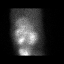
[frame 59/64]
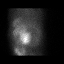

[Series 2: stress gated · 8.28mm/px · 6 of 64 frames shown]
[frame 6/64]
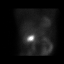
[frame 16/64]
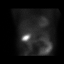
[frame 27/64]
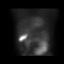
[frame 38/64]
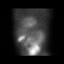
[frame 48/64]
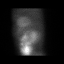
[frame 59/64]
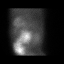

[12 of 12 positions shown; findings below may reference images not displayed]

Canned report from images found in remote index.

Refer to host system for actual result text.

## 2017-09-26 DIAGNOSIS — Z23 Encounter for immunization: Secondary | ICD-10-CM | POA: Diagnosis not present

## 2018-02-26 DIAGNOSIS — S335XXA Sprain of ligaments of lumbar spine, initial encounter: Secondary | ICD-10-CM | POA: Diagnosis not present

## 2018-02-26 DIAGNOSIS — S134XXA Sprain of ligaments of cervical spine, initial encounter: Secondary | ICD-10-CM | POA: Diagnosis not present

## 2018-02-26 DIAGNOSIS — S233XXA Sprain of ligaments of thoracic spine, initial encounter: Secondary | ICD-10-CM | POA: Diagnosis not present

## 2018-05-20 DIAGNOSIS — R072 Precordial pain: Secondary | ICD-10-CM | POA: Diagnosis not present

## 2018-05-20 DIAGNOSIS — Z79899 Other long term (current) drug therapy: Secondary | ICD-10-CM | POA: Diagnosis not present

## 2018-05-20 DIAGNOSIS — J45909 Unspecified asthma, uncomplicated: Secondary | ICD-10-CM | POA: Diagnosis not present

## 2018-05-20 DIAGNOSIS — R079 Chest pain, unspecified: Secondary | ICD-10-CM | POA: Diagnosis not present

## 2018-05-20 DIAGNOSIS — I10 Essential (primary) hypertension: Secondary | ICD-10-CM | POA: Diagnosis not present

## 2018-05-20 DIAGNOSIS — Z7982 Long term (current) use of aspirin: Secondary | ICD-10-CM | POA: Diagnosis not present

## 2018-05-21 DIAGNOSIS — Z0001 Encounter for general adult medical examination with abnormal findings: Secondary | ICD-10-CM | POA: Diagnosis not present

## 2018-05-25 DIAGNOSIS — Z0001 Encounter for general adult medical examination with abnormal findings: Secondary | ICD-10-CM | POA: Diagnosis not present

## 2018-05-25 DIAGNOSIS — E8881 Metabolic syndrome: Secondary | ICD-10-CM | POA: Diagnosis not present

## 2018-05-25 DIAGNOSIS — R7301 Impaired fasting glucose: Secondary | ICD-10-CM | POA: Diagnosis not present

## 2018-05-25 DIAGNOSIS — E782 Mixed hyperlipidemia: Secondary | ICD-10-CM | POA: Diagnosis not present

## 2018-05-25 DIAGNOSIS — J4520 Mild intermittent asthma, uncomplicated: Secondary | ICD-10-CM | POA: Diagnosis not present

## 2018-10-04 DIAGNOSIS — Z23 Encounter for immunization: Secondary | ICD-10-CM | POA: Diagnosis not present

## 2018-11-17 DIAGNOSIS — Z6829 Body mass index (BMI) 29.0-29.9, adult: Secondary | ICD-10-CM | POA: Diagnosis not present

## 2018-11-17 DIAGNOSIS — L0231 Cutaneous abscess of buttock: Secondary | ICD-10-CM | POA: Diagnosis not present

## 2018-11-19 DIAGNOSIS — Z9189 Other specified personal risk factors, not elsewhere classified: Secondary | ICD-10-CM | POA: Diagnosis not present

## 2018-11-19 DIAGNOSIS — R7301 Impaired fasting glucose: Secondary | ICD-10-CM | POA: Diagnosis not present

## 2018-11-19 DIAGNOSIS — E8881 Metabolic syndrome: Secondary | ICD-10-CM | POA: Diagnosis not present

## 2018-11-19 DIAGNOSIS — Z13220 Encounter for screening for lipoid disorders: Secondary | ICD-10-CM | POA: Diagnosis not present

## 2018-11-23 DIAGNOSIS — E8881 Metabolic syndrome: Secondary | ICD-10-CM | POA: Diagnosis not present

## 2018-11-23 DIAGNOSIS — Z1389 Encounter for screening for other disorder: Secondary | ICD-10-CM | POA: Diagnosis not present

## 2018-11-23 DIAGNOSIS — E782 Mixed hyperlipidemia: Secondary | ICD-10-CM | POA: Diagnosis not present

## 2018-11-23 DIAGNOSIS — Z1331 Encounter for screening for depression: Secondary | ICD-10-CM | POA: Diagnosis not present

## 2018-11-23 DIAGNOSIS — R7301 Impaired fasting glucose: Secondary | ICD-10-CM | POA: Diagnosis not present

## 2018-11-23 DIAGNOSIS — J4520 Mild intermittent asthma, uncomplicated: Secondary | ICD-10-CM | POA: Diagnosis not present

## 2018-12-22 ENCOUNTER — Telehealth: Payer: Self-pay | Admitting: Internal Medicine

## 2018-12-22 NOTE — Telephone Encounter (Signed)
Called and spoke with patient-patient reports he has been "dealing with this issue for the past 6 weeks, "a hole" in the area that is consistently draining"; tenderness, swelling is about 1in wide x 3.5-4in long x 0.75-1in thick;  The antibiotic therapies were 7 days a piece and 5 days in between antibiotic therapy;  Area is "very close to the rectum"  Patient denies fevers, reports soreness-negative for pain-has not stopped the patient from ADL's-patient reports having to take precautions with activity as the draining is so heavy";  Patient reports the area near scrotum and does not feel he can wait to be treated;  Patient is currently being treated by his PCP, however, the patient feels he has "dealth with this issue for about as long as he has been able";   Please advise

## 2018-12-23 NOTE — Telephone Encounter (Signed)
Called and spoke with patient-patient informed of MD recommendations; patient is agreeable with plan of care;  Patient verbalized understanding of information/instructions;  Patient was advised to call the office at [phone] if questions/concerns arise; 

## 2018-12-23 NOTE — Telephone Encounter (Signed)
I don't know this patient well. Not familiar with his current problem but does not sound like a GI medicine problem. He may need to see a general surgeon if this is an abscess or fistula. He should call his PCP ASAP to arrange such. Thanks

## 2018-12-30 DIAGNOSIS — Z6831 Body mass index (BMI) 31.0-31.9, adult: Secondary | ICD-10-CM | POA: Diagnosis not present

## 2018-12-30 DIAGNOSIS — L293 Anogenital pruritus, unspecified: Secondary | ICD-10-CM | POA: Insufficient documentation

## 2018-12-30 DIAGNOSIS — K611 Rectal abscess: Secondary | ICD-10-CM | POA: Diagnosis not present

## 2019-02-15 DIAGNOSIS — L293 Anogenital pruritus, unspecified: Secondary | ICD-10-CM | POA: Diagnosis not present

## 2019-02-15 DIAGNOSIS — Z6831 Body mass index (BMI) 31.0-31.9, adult: Secondary | ICD-10-CM | POA: Diagnosis not present

## 2019-02-18 DIAGNOSIS — K4020 Bilateral inguinal hernia, without obstruction or gangrene, not specified as recurrent: Secondary | ICD-10-CM | POA: Diagnosis not present

## 2019-02-18 DIAGNOSIS — L293 Anogenital pruritus, unspecified: Secondary | ICD-10-CM | POA: Diagnosis not present

## 2019-02-18 DIAGNOSIS — K5730 Diverticulosis of large intestine without perforation or abscess without bleeding: Secondary | ICD-10-CM | POA: Diagnosis not present

## 2019-02-18 DIAGNOSIS — K4090 Unilateral inguinal hernia, without obstruction or gangrene, not specified as recurrent: Secondary | ICD-10-CM | POA: Diagnosis not present

## 2019-03-04 DIAGNOSIS — Z6830 Body mass index (BMI) 30.0-30.9, adult: Secondary | ICD-10-CM | POA: Diagnosis not present

## 2019-03-04 DIAGNOSIS — L0231 Cutaneous abscess of buttock: Secondary | ICD-10-CM | POA: Diagnosis not present

## 2019-08-24 ENCOUNTER — Encounter (INDEPENDENT_AMBULATORY_CARE_PROVIDER_SITE_OTHER): Payer: Self-pay

## 2019-08-24 ENCOUNTER — Ambulatory Visit (INDEPENDENT_AMBULATORY_CARE_PROVIDER_SITE_OTHER): Payer: Managed Care, Other (non HMO) | Admitting: General Surgery

## 2019-08-24 ENCOUNTER — Other Ambulatory Visit: Payer: Self-pay

## 2019-08-24 ENCOUNTER — Encounter: Payer: Self-pay | Admitting: General Surgery

## 2019-08-24 VITALS — BP 158/97 | HR 74 | Temp 98.6°F | Resp 16 | Ht 69.0 in | Wt 213.0 lb

## 2019-08-24 DIAGNOSIS — K603 Anal fistula: Secondary | ICD-10-CM

## 2019-08-24 NOTE — Progress Notes (Signed)
Barry Ross; [id number]; 07/29/1966   HPI Patient is a 53 year old white male who was referred to my care by Dr. Gar Ponto for evaluation treatment of perirectal pain.  Patient had incision and drainage of a perirectal abscess last year in [facility].  He stated he had to pack the wound until it closed.  He has had ongoing issues since that time.  He has perirectal pain with intermittent drainage from an opening at the base of the scrotum.  He has been on antibiotics intermittently for many months.  The drainage currently is intermittent and a pinkish-yellow.  No stool is present.  He denies any incontinence.  He was requesting a second opinion as to any further treatment options.  He has a pain level of 5 out of 10.  No history of Crohn's disease or ulcerative colitis. Past Medical History:  Diagnosis Date  . Allergic rhinitis   . Allergy   . Asthma   . Clotting disorder ([place])   . Depression    Patient denies  . GERD (gastroesophageal reflux disease)   . Hyperlipidemia     Past Surgical History:  Procedure Laterality Date  . anal cyst removal  2015  . BALLOON DILATION  06/07/2011   Procedure: BALLOON DILATION;  Surgeon: Rogene Houston, MD;  [facility];  Service: Endoscopy;  Laterality: N/A;  . ESOPHAGOGASTRODUODENOSCOPY  06/07/2011   Procedure: ESOPHAGOGASTRODUODENOSCOPY (EGD);  Surgeon: Rogene Houston, MD;  Location: [facility];  Service: Endoscopy;  Laterality: N/A;  . 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 Diabetes Unknown   . Heart disease Unknown   . Kidney disease Unknown   . Colon cancer Neg Hx   . Esophageal cancer Neg Hx   . Pancreatic cancer Neg Hx   . Rectal cancer Neg Hx   . Stomach cancer Neg Hx     Current Outpatient Medications on File Prior to Visit  Medication Sig Dispense Refill  . aspirin EC 81 MG tablet Take 81 mg by mouth daily.    Marland Kitchen atorvastatin (LIPITOR) 20 MG tablet Take 20  mg by mouth daily.    . budesonide-formoterol (SYMBICORT) 160-4.5 MCG/ACT inhaler Inhale 1-2 puffs into the lungs as needed.    . naproxen (NAPROSYN) 500 MG tablet Take 500 mg by mouth as needed.      Marland Kitchen OVER THE COUNTER MEDICATION Amino Acid Powder, I scoop in water.    Marland Kitchen OVER THE COUNTER MEDICATION Creatine Powder, 1 scoop with water daily.    Marland Kitchen OVER THE COUNTER MEDICATION Protein Shakes, I daily    . OVER THE COUNTER MEDICATION Vitamin C 1 tablet daily.    Marland Kitchen OVER THE COUNTER MEDICATION     . OVER THE COUNTER MEDICATION Vitamin D, 1 tablet daily.    Marland Kitchen OVER THE COUNTER MEDICATION Vitamin E, 1 tablet daily.    Marland Kitchen OVER THE COUNTER MEDICATION Fish Oil, 1000 mg capsule 1 daily.    . pantoprazole (PROTONIX) 40 MG tablet Take 40 mg by mouth daily.    . tadalafil (CIALIS) 20 MG tablet Take 20 mg by mouth daily as needed for erectile dysfunction.    . pantoprazole (PROTONIX) 40 MG tablet Take 1 tablet (40 mg total) by mouth daily. 30 tablet 5   Current Facility-Administered Medications on File Prior to Visit  Medication Dose Route Frequency Provider Last Rate Last Dose  . 0.9 %  sodium chloride infusion  500 mL Intravenous Continuous Irene Shipper, MD        Allergies  Allergen Reactions  . Other     IV IG hemoglobin    Social History   Substance and Sexual Activity  Alcohol Use Yes  . Alcohol/week: 13.0 standard drinks  . Types: 12 Cans of beer, 1 Shots of liquor per week    Social History   Tobacco Use  Smoking Status Never Smoker  Smokeless Tobacco Current User  . Types: Snuff    Review of Systems  Constitutional: Negative.   HENT: Negative.   Eyes: Negative.   Respiratory: Negative.   Cardiovascular: Negative.   Gastrointestinal: Negative.   Genitourinary: Negative.   Musculoskeletal: Negative.   Skin: Negative.   Neurological: Negative.   Endo/Heme/Allergies: Negative.   Psychiatric/Behavioral: Negative.     Objective   Vitals:   08/24/19 1524 08/24/19 1528   BP: (!) 149/95 (!) 158/97  Pulse: 74   Resp: 16   Temp: 98.6 F (37 C)   SpO2: 96%     Physical Exam Vitals signs reviewed.  Constitutional:      Appearance: Normal appearance. He is normal weight. He is not ill-appearing.  HENT:     Head: Normocephalic and atraumatic.  Cardiovascular:     Rate and Rhythm: Normal rate and regular rhythm.     Heart sounds: Normal heart sounds. No murmur. No friction rub. No gallop.   Pulmonary:     Effort: Pulmonary effort is normal. No respiratory distress.     Breath sounds: Normal breath sounds. No stridor. No wheezing, rhonchi or rales.  Abdominal:     General: There is no distension.     Palpations: Abdomen is soft. There is no mass.     Tenderness: There is no abdominal tenderness. There is no guarding or rebound.  Genitourinary:    Comments: Healed perianal wound noted outside the anal verge at the 1 o'clock position with an indurated subcutaneous tunnel up to 2 granulomatous wounds at the base of the left scrotum.  He also appears to have a small anterior anal fissure present.  No erythema is present.  Sphincter tone is normal. Skin:    General: Skin is warm and dry.  Neurological:     Mental Status: He is alert and oriented to person, place, and time.   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  Surgical options include examination under anesthesia and attempts to remove any infection present and treat the fistula.  This is not an ideal spot for a complete fistulotomy with secondary closure.  I would attempt to debride the fistula without full unroofing.  It does not appear to affect the external sphincter mechanism.  The patient will call back and schedule an anal fistulotomy.  The risks and benefits of the procedure including bleeding, infection, incontinence, and recurrence of the fistula were fully explained to the patient,  who gave informed consent.

## 2019-08-24 NOTE — Patient Instructions (Signed)
Anal Fistula  An anal fistula is a hole that develops between the bowel and the skin near the anus. The anus allows stool (feces) to leave the body. The anus has many tiny glands that make lubricating fluid. Sometimes, these glands become plugged and infected. This can cause a fluid-filled pocket (abscess) to form. An anal fistula often occurs when an abscess becomes infected and then develops into a hole between the bowel and the skin. What are the causes? In most cases, an anal fistula is caused by a past or current buildup of pus around the anus (anal abscess). Other causes include:  A complication of surgery.  Injury to the rectum or the area around it.  Using high-energy beams (radiation) to treat the area around the rectum. What increases the risk? You are more likely to develop this condition if you have certain medical conditions or diseases, including:  Chronic inflammatory bowel disease, such as Crohn's disease or ulcerative colitis.  Colon cancer or rectal cancer.  Diverticular disease, such as diverticulitis.  A sexually transmitted infection, or STI, such as gonorrhea, chlamydia, or syphilis.  An infection that is caused by HIV. What are the signs or symptoms? Symptoms of this condition include:  Throbbing or constant pain that may be worse while you are sitting.  Swelling or irritation around the anus.  Pus or blood from an opening near the anus.  Pain when passing stool.  Fever or chills. How is this diagnosed? This condition is diagnosed based on:  A physical exam. This may include: ? An exam to find the external opening of the fistula. ? An exam with a probe or scope to help locate the internal opening of the fistula. ? An exam of the rectum with a gloved hand (digital rectal exam).  Imaging tests that use dye to find the exact location and path of the fistula. Tests may include: ? X-rays. ? Ultrasound. ? CT scan. ? MRI.  Other tests to find the cause  of the anal fistula. How is this treated? This condition is most commonly treated with surgery. The type of surgery that is used will depend on where the fistula is located and how complex the fistula is. Surgery may include:  A fistulotomy. The whole fistula is opened up, and the contents are drained to promote healing.  Seton placement. A silk string (seton) is placed into the fistula during a fistulotomy. This helps to drain any infection and promote healing.  Advancement flap procedure. Tissue is removed from your rectum or the skin around the anus and attached to the opening of the fistula.  Bioprosthetic plug. A cone-shaped plug is made from your tissue and is used to block the opening of the fistula. Some anal fistulas do not require surgery. A nonsurgical treatment option involves injecting a fibrin glue to seal the fistula. You also may be prescribed an antibiotic medicine to treat any infection. Follow these instructions at home: Medicines  Take over-the-counter and prescription medicines only as told by your health care provider.  If you were prescribed an antibiotic medicine, take it as told by your health care provider. Do not stop taking the antibiotic even if you start to feel better.  Use a stool softener or a laxative if told to do so by your health care provider. General instructions   Eat a high-fiber diet as told by your health care provider. This can help to prevent constipation.  Drink enough fluid to keep your urine pale yellow.    Take a warm sitz bath for 15-20 minutes, 3-4 times per day, or as told by your health care provider. Sitz baths can ease your pain and discomfort and help with healing.  Follow good hygiene to keep the anal area as clean and dry as possible. Use wet toilet paper or a moist towelette after each bowel movement.  Keep all follow-up visits as told by your health care provider. This is important. Contact a health care provider if you have:   Increased pain that is not controlled with medicines.  New redness or swelling around the anal area.  New fluid, blood, or pus coming from the anal area.  Tenderness or warmth around the anal area. Get help right away if you have:  A fever.  Severe pain.  Chills or diarrhea.  Severe problems urinating or having a bowel movement. Summary  An anal fistula is a hole that develops between the bowel and the skin near the anus.  This condition is most often caused by a buildup of pus around the anus (anal abscess). Other causes include a complication of surgery, an injury to the rectum, or the use of radiation to treat the rectal area.  This condition is most commonly treated with surgery.  Follow your health care provider's instructions about taking medicines, eating and drinking, or taking sitz baths.  Call your health care provider if you have more pain, swelling, or blood. Get help right away if you have fever, severe pain, or problems passing urine or stool. This information is not intended to replace advice given to you by your health care provider. Make sure you discuss any questions you have with your health care provider. Document Released: 10/10/2008 Document Revised: 03/13/2018 Document Reviewed: 03/13/2018 Elsevier Patient Education  2020 [employer]vier Inc[employer]  

## 2019-09-14 NOTE — H&P (Signed)
Barry Ross; [id number]; Mar 21, 1966   HPI Patient is a 53 year old white male who was referred to my care by Dr. Gar Ponto for evaluation treatment of perirectal pain.  Patient had incision and drainage of a perirectal abscess last year in [facility].  He stated he had to pack the wound until it closed.  He has had ongoing issues since that time.  He has perirectal pain with intermittent drainage from an opening at the base of the scrotum.  He has been on antibiotics intermittently for many months.  The drainage currently is intermittent and a pinkish-yellow.  No stool is present.  He denies any incontinence.  He was requesting a second opinion as to any further treatment options.  He has a pain level of 5 out of 10.  No history of Crohn's disease or ulcerative colitis. Past Medical History:  Diagnosis Date  . Allergic rhinitis   . Allergy   . Asthma   . Clotting disorder ([place])   . Depression    Patient denies  . GERD (gastroesophageal reflux disease)   . Hyperlipidemia     Past Surgical History:  Procedure Laterality Date  . anal cyst removal  2015  . BALLOON DILATION  06/07/2011   Procedure: BALLOON DILATION;  Surgeon: Rogene Houston, MD;  [facility];  Service: Endoscopy;  Laterality: N/A;  . ESOPHAGOGASTRODUODENOSCOPY  06/07/2011   Procedure: ESOPHAGOGASTRODUODENOSCOPY (EGD);  Surgeon: Rogene Houston, MD;  [facility];  Service: Endoscopy;  Laterality: N/A;  . 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 Diabetes Unknown   . Heart disease Unknown   . Kidney disease Unknown   . Colon cancer Neg Hx   . Esophageal cancer Neg Hx   . Pancreatic cancer Neg Hx   . Rectal cancer Neg Hx   . Stomach cancer Neg Hx     Current Outpatient Medications on File Prior to Visit  Medication Sig Dispense Refill  . aspirin EC 81 MG tablet Take 81 mg by mouth daily.    Marland Kitchen atorvastatin (LIPITOR) 20 MG tablet Take 20  mg by mouth daily.    . budesonide-formoterol (SYMBICORT) 160-4.5 MCG/ACT inhaler Inhale 1-2 puffs into the lungs as needed.    . naproxen (NAPROSYN) 500 MG tablet Take 500 mg by mouth as needed.      Marland Kitchen OVER THE COUNTER MEDICATION Amino Acid Powder, I scoop in water.    Marland Kitchen OVER THE COUNTER MEDICATION Creatine Powder, 1 scoop with water daily.    Marland Kitchen OVER THE COUNTER MEDICATION Protein Shakes, I daily    . OVER THE COUNTER MEDICATION Vitamin C 1 tablet daily.    Marland Kitchen OVER THE COUNTER MEDICATION     . OVER THE COUNTER MEDICATION Vitamin D, 1 tablet daily.    Marland Kitchen OVER THE COUNTER MEDICATION Vitamin E, 1 tablet daily.    Marland Kitchen OVER THE COUNTER MEDICATION Fish Oil, 1000 mg capsule 1 daily.    . pantoprazole (PROTONIX) 40 MG tablet Take 40 mg by mouth daily.    . tadalafil (CIALIS) 20 MG tablet Take 20 mg by mouth daily as needed for erectile dysfunction.    . pantoprazole (PROTONIX) 40 MG tablet Take 1 tablet (40 mg total) by mouth daily. 30 tablet 5   Current Facility-Administered Medications on File Prior to Visit  Medication Dose Route Frequency Provider Last Rate Last Dose  . 0.9 %  sodium chloride infusion  500 mL Intravenous Continuous Irene Shipper, MD        Allergies  Allergen Reactions  . Other     IV IG hemoglobin    Social History   Substance and Sexual Activity  Alcohol Use Yes  . Alcohol/week: 13.0 standard drinks  . Types: 12 Cans of beer, 1 Shots of liquor per week    Social History   Tobacco Use  Smoking Status Never Smoker  Smokeless Tobacco Current User  . Types: Snuff    Review of Systems  Constitutional: Negative.   HENT: Negative.   Eyes: Negative.   Respiratory: Negative.   Cardiovascular: Negative.   Gastrointestinal: Negative.   Genitourinary: Negative.   Musculoskeletal: Negative.   Skin: Negative.   Neurological: Negative.   Endo/Heme/Allergies: Negative.   Psychiatric/Behavioral: Negative.     Objective   Vitals:   08/24/19 1524 08/24/19 1528   BP: (!) 149/95 (!) 158/97  Pulse: 74   Resp: 16   Temp: 98.6 F (37 C)   SpO2: 96%     Physical Exam Vitals signs reviewed.  Constitutional:      Appearance: Normal appearance. He is normal weight. He is not ill-appearing.  HENT:     Head: Normocephalic and atraumatic.  Cardiovascular:     Rate and Rhythm: Normal rate and regular rhythm.     Heart sounds: Normal heart sounds. No murmur. No friction rub. No gallop.   Pulmonary:     Effort: Pulmonary effort is normal. No respiratory distress.     Breath sounds: Normal breath sounds. No stridor. No wheezing, rhonchi or rales.  Abdominal:     General: There is no distension.     Palpations: Abdomen is soft. There is no mass.     Tenderness: There is no abdominal tenderness. There is no guarding or rebound.  Genitourinary:    Comments: Healed perianal wound noted outside the anal verge at the 1 o'clock position with an indurated subcutaneous tunnel up to 2 granulomatous wounds at the base of the left scrotum.  He also appears to have a small anterior anal fissure present.  No erythema is present.  Sphincter tone is normal. Skin:    General: Skin is warm and dry.  Neurological:     Mental Status: He is alert and oriented to person, place, and time.   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  Surgical options include examination under anesthesia and attempts to remove any infection present and treat the fistula.  This is not an ideal spot for a complete fistulotomy with secondary closure.  I would attempt to debride the fistula without full unroofing.  It does not appear to affect the external sphincter mechanism.  The patient will call back and schedule an anal fistulotomy.  The risks and benefits of the procedure including bleeding, infection, incontinence, and recurrence of the fistula were fully explained to the patient,  who gave informed consent.

## 2019-09-15 ENCOUNTER — Other Ambulatory Visit: Payer: Self-pay

## 2019-09-15 ENCOUNTER — Other Ambulatory Visit (HOSPITAL_COMMUNITY)
Admission: RE | Admit: 2019-09-15 | Discharge: 2019-09-15 | Disposition: A | Discharge status: Home / Self Care | Payer: Managed Care, Other (non HMO) | Source: Ambulatory Visit | Attending: General Surgery | Admitting: General Surgery

## 2019-09-15 ENCOUNTER — Encounter (HOSPITAL_COMMUNITY): Payer: Self-pay

## 2019-09-15 ENCOUNTER — Encounter (HOSPITAL_COMMUNITY)
Admission: RE | Admit: 2019-09-15 | Discharge: 2019-09-15 | Disposition: A | Discharge status: Home / Self Care | Payer: Managed Care, Other (non HMO) | Source: Ambulatory Visit | Attending: General Surgery | Admitting: General Surgery

## 2019-09-15 DIAGNOSIS — Z20828 Contact with and (suspected) exposure to other viral communicable diseases: Secondary | ICD-10-CM | POA: Diagnosis not present

## 2019-09-15 DIAGNOSIS — Z01812 Encounter for preprocedural laboratory examination: Secondary | ICD-10-CM | POA: Insufficient documentation

## 2019-09-15 LAB — SARS CORONAVIRUS 2 (TAT 6-24 HRS): SARS Coronavirus 2: NEGATIVE

## 2019-09-17 ENCOUNTER — Ambulatory Visit (HOSPITAL_COMMUNITY): Payer: Managed Care, Other (non HMO) | Admitting: Anesthesiology

## 2019-09-17 ENCOUNTER — Ambulatory Visit (HOSPITAL_COMMUNITY)
Admission: RE | Admit: 2019-09-17 | Discharge: 2019-09-17 | Disposition: A | Discharge status: Home / Self Care | Payer: Managed Care, Other (non HMO) | Attending: General Surgery | Admitting: General Surgery

## 2019-09-17 ENCOUNTER — Encounter (HOSPITAL_COMMUNITY)
Admission: RE | Disposition: A | Discharge status: Home / Self Care | Payer: Self-pay | Source: Home / Self Care | Attending: General Surgery

## 2019-09-17 ENCOUNTER — Encounter (HOSPITAL_COMMUNITY): Payer: Self-pay | Admitting: *Deleted

## 2019-09-17 DIAGNOSIS — K219 Gastro-esophageal reflux disease without esophagitis: Secondary | ICD-10-CM | POA: Insufficient documentation

## 2019-09-17 DIAGNOSIS — Z9081 Acquired absence of spleen: Secondary | ICD-10-CM | POA: Diagnosis not present

## 2019-09-17 DIAGNOSIS — Z79899 Other long term (current) drug therapy: Secondary | ICD-10-CM | POA: Diagnosis not present

## 2019-09-17 DIAGNOSIS — K6289 Other specified diseases of anus and rectum: Secondary | ICD-10-CM | POA: Diagnosis present

## 2019-09-17 DIAGNOSIS — Z7982 Long term (current) use of aspirin: Secondary | ICD-10-CM | POA: Diagnosis not present

## 2019-09-17 DIAGNOSIS — J45909 Unspecified asthma, uncomplicated: Secondary | ICD-10-CM | POA: Insufficient documentation

## 2019-09-17 DIAGNOSIS — K603 Anal fistula: Secondary | ICD-10-CM | POA: Insufficient documentation

## 2019-09-17 HISTORY — PX: ANAL FISTULOTOMY: SHX6423

## 2019-09-17 SURGERY — ANAL FISTULOTOMY
Anesthesia: General | Site: Rectum

## 2019-09-17 MED ORDER — LACTATED RINGERS IV SOLN
INTRAVENOUS | Status: DC | PRN
  Administered 2019-09-17: 07:00:00 via INTRAVENOUS

## 2019-09-17 MED ORDER — PROPOFOL 10 MG/ML IV BOLUS
INTRAVENOUS | Status: DC | PRN
  Administered 2019-09-17: 225 mg via INTRAVENOUS

## 2019-09-17 MED ORDER — LACTATED RINGERS IV SOLN: INTRAVENOUS | Status: DC

## 2019-09-17 MED ORDER — LIDOCAINE HCL (CARDIAC) PF 100 MG/5ML IV SOSY
PREFILLED_SYRINGE | INTRAVENOUS | Status: DC | PRN
  Administered 2019-09-17: 60 mg via INTRAVENOUS

## 2019-09-17 MED ORDER — FENTANYL CITRATE (PF) 100 MCG/2ML IJ SOLN
INTRAMUSCULAR | Status: AC
  Filled 2019-09-17: qty 2

## 2019-09-17 MED ORDER — KETOROLAC TROMETHAMINE 30 MG/ML IJ SOLN: 30.0000 mg | Freq: Once | INTRAMUSCULAR | Status: DC

## 2019-09-17 MED ORDER — HYDROCODONE-ACETAMINOPHEN 7.5-325 MG PO TABS: 1.0000 | ORAL_TABLET | Freq: Once | ORAL | Status: DC | PRN

## 2019-09-17 MED ORDER — BUPIVACAINE LIPOSOME 1.3 % IJ SUSP
INTRAMUSCULAR | Status: DC | PRN
  Administered 2019-09-17: 10 mL

## 2019-09-17 MED ORDER — FENTANYL CITRATE (PF) 100 MCG/2ML IJ SOLN
INTRAMUSCULAR | Status: DC | PRN
  Administered 2019-09-17: 25 ug via INTRAVENOUS
  Administered 2019-09-17: 50 ug via INTRAVENOUS
  Administered 2019-09-17: 25 ug via INTRAVENOUS
  Administered 2019-09-17 (×2): 50 ug via INTRAVENOUS

## 2019-09-17 MED ORDER — HYDROCODONE-ACETAMINOPHEN 5-325 MG PO TABS: 1.0000 | ORAL_TABLET | ORAL | 0 refills | Status: DC | PRN

## 2019-09-17 MED ORDER — ONDANSETRON HCL 4 MG/2ML IJ SOLN
INTRAMUSCULAR | Status: AC
  Filled 2019-09-17: qty 2

## 2019-09-17 MED ORDER — MIDAZOLAM HCL 5 MG/5ML IJ SOLN
INTRAMUSCULAR | Status: DC | PRN
  Administered 2019-09-17: 2 mg via INTRAVENOUS

## 2019-09-17 MED ORDER — HYDROMORPHONE HCL 1 MG/ML IJ SOLN: 0.2500 mg | INTRAMUSCULAR | Status: DC | PRN

## 2019-09-17 MED ORDER — ONDANSETRON HCL 4 MG/2ML IJ SOLN
INTRAMUSCULAR | Status: DC | PRN
  Administered 2019-09-17: 4 mg via INTRAVENOUS

## 2019-09-17 MED ORDER — CHLORHEXIDINE GLUCONATE CLOTH 2 % EX PADS: 6.0000 | MEDICATED_PAD | Freq: Once | CUTANEOUS | Status: DC

## 2019-09-17 MED ORDER — SODIUM CHLORIDE 0.9 % IR SOLN
Status: DC | PRN
  Administered 2019-09-17: 1000 mL

## 2019-09-17 MED ORDER — LIDOCAINE 2% (20 MG/ML) 5 ML SYRINGE
INTRAMUSCULAR | Status: AC
  Filled 2019-09-17: qty 5

## 2019-09-17 MED ORDER — LIDOCAINE VISCOUS HCL 2 % MT SOLN
OROMUCOSAL | Status: DC | PRN
  Administered 2019-09-17: 1

## 2019-09-17 MED ORDER — MIDAZOLAM HCL 2 MG/2ML IJ SOLN: 0.5000 mg | Freq: Once | INTRAMUSCULAR | Status: DC | PRN

## 2019-09-17 MED ORDER — DEXAMETHASONE SODIUM PHOSPHATE 10 MG/ML IJ SOLN
INTRAMUSCULAR | Status: AC
  Filled 2019-09-17: qty 1

## 2019-09-17 MED ORDER — GLYCOPYRROLATE PF 0.2 MG/ML IJ SOSY
PREFILLED_SYRINGE | INTRAMUSCULAR | Status: DC | PRN
  Administered 2019-09-17: .2 mg via INTRAVENOUS

## 2019-09-17 MED ORDER — HYDROGEN PEROXIDE 3 % EX SOLN
CUTANEOUS | Status: DC | PRN
  Administered 2019-09-17: 1

## 2019-09-17 MED ORDER — PROMETHAZINE HCL 25 MG/ML IJ SOLN: 6.2500 mg | INTRAMUSCULAR | Status: DC | PRN

## 2019-09-17 MED ORDER — DEXAMETHASONE SODIUM PHOSPHATE 4 MG/ML IJ SOLN
INTRAMUSCULAR | Status: DC | PRN
  Administered 2019-09-17: 8 mg via INTRAVENOUS

## 2019-09-17 MED ORDER — MIDAZOLAM HCL 2 MG/2ML IJ SOLN
INTRAMUSCULAR | Status: AC
  Filled 2019-09-17: qty 2

## 2019-09-17 MED ORDER — SODIUM CHLORIDE 0.9 % IV SOLN
2.0000 g | INTRAVENOUS | Status: AC
  Administered 2019-09-17: 2 g via INTRAVENOUS
  Filled 2019-09-17: qty 2

## 2019-09-17 MED ORDER — LIDOCAINE VISCOUS HCL 2 % MT SOLN
OROMUCOSAL | Status: AC
  Filled 2019-09-17: qty 15

## 2019-09-17 MED ORDER — BUPIVACAINE LIPOSOME 1.3 % IJ SUSP
INTRAMUSCULAR | Status: AC
  Filled 2019-09-17: qty 20

## 2019-09-17 SURGICAL SUPPLY — 31 items
CLOTH BEACON ORANGE TIMEOUT ST (SAFETY) ×3 IMPLANT
COVER LIGHT HANDLE STERIS (MISCELLANEOUS) ×6 IMPLANT
COVER WAND RF STERILE (DRAPES) ×3 IMPLANT
DRAPE HALF SHEET 40X57 (DRAPES) ×3 IMPLANT
ELECT REM PT RETURN 9FT ADLT (ELECTROSURGICAL) ×3
ELECTRODE REM PT RTRN 9FT ADLT (ELECTROSURGICAL) ×1 IMPLANT
GAUZE PACKING IODOFORM 1/4X15 (GAUZE/BANDAGES/DRESSINGS) ×3 IMPLANT
GAUZE SPONGE 4X4 12PLY STRL (GAUZE/BANDAGES/DRESSINGS) ×3 IMPLANT
GLOVE BIO SURGEON STRL SZ 6.5 (GLOVE) ×2 IMPLANT
GLOVE BIO SURGEON STRL SZ7 (GLOVE) ×3 IMPLANT
GLOVE BIO SURGEONS STRL SZ 6.5 (GLOVE) ×1
GLOVE BIOGEL PI IND STRL 7.0 (GLOVE) ×4 IMPLANT
GLOVE BIOGEL PI INDICATOR 7.0 (GLOVE) ×8
GLOVE SURG SS PI 7.5 STRL IVOR (GLOVE) ×3 IMPLANT
GOWN STRL REUS W/TWL LRG LVL3 (GOWN DISPOSABLE) ×6 IMPLANT
GOWN STRL REUS W/TWL XL LVL3 (GOWN DISPOSABLE) ×3 IMPLANT
HEMOSTAT SURGICEL 4X8 (HEMOSTASIS) ×3 IMPLANT
KIT TURNOVER CYSTO (KITS) ×3 IMPLANT
MANIFOLD NEPTUNE II (INSTRUMENTS) ×3 IMPLANT
NEEDLE HYPO 18GX1.5 BLUNT FILL (NEEDLE) ×3 IMPLANT
NEEDLE HYPO 22GX1.5 SAFETY (NEEDLE) ×3 IMPLANT
NS IRRIG 1000ML POUR BTL (IV SOLUTION) ×3 IMPLANT
PACK PERI GYN (CUSTOM PROCEDURE TRAY) ×3 IMPLANT
PAD ABD 5X9 TENDERSORB (GAUZE/BANDAGES/DRESSINGS) ×3 IMPLANT
PAD ARMBOARD 7.5X6 YLW CONV (MISCELLANEOUS) ×3 IMPLANT
SET BASIN LINEN APH (SET/KITS/TRAYS/PACK) ×3 IMPLANT
SURGILUBE 2OZ TUBE FLIPTOP (MISCELLANEOUS) ×3 IMPLANT
SUT MNCRL AB 4-0 PS2 18 (SUTURE) ×3 IMPLANT
SUT SILK 0 FSL (SUTURE) ×3 IMPLANT
SYR 20ML LL LF (SYRINGE) ×3 IMPLANT
SYR CONTROL 10ML LL (SYRINGE) ×3 IMPLANT

## 2019-09-17 NOTE — Anesthesia Preprocedure Evaluation (Addendum)
Anesthesia Evaluation  Patient identified by MRN, date of birth, ID band Patient awake    Reviewed: Allergy & Precautions, NPO status , Patient's Chart, lab work & pertinent test results  Airway Mallampati: II  TM Distance: >3 FB Neck ROM: Full    Dental no notable dental hx. (+) Teeth Intact   Pulmonary asthma ,    Pulmonary exam normal breath sounds clear to auscultation       Cardiovascular Exercise Tolerance: Good negative cardio ROS Normal cardiovascular examI Rhythm:Regular Rate:Normal     Neuro/Psych PSYCHIATRIC DISORDERS Depression negative neurological ROS     GI/Hepatic Neg liver ROS, GERD  Medicated and Controlled,  Endo/Other  negative endocrine ROS  Renal/GU negative Renal ROS  negative genitourinary   Musculoskeletal negative musculoskeletal ROS (+)   Abdominal   Peds negative pediatric ROS (+)  Hematology negative hematology ROS (+)   Anesthesia Other Findings 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 Reports also one hosp for asthma ~20 years prior -denies ETT use at that time   Reproductive/Obstetrics negative OB ROS                            Anesthesia Physical Anesthesia Plan  ASA: II  Anesthesia Plan: General   Post-op Pain Management:    Induction: Intravenous  PONV Risk Score and Plan: 2 and Ondansetron, Dexamethasone, Treatment may vary due to age or medical condition and Midazolam  Airway Management Planned: LMA and Oral ETT  Additional Equipment:   Intra-op Plan:   Post-operative Plan: Extubation in OR  Informed Consent: I have reviewed the patients History and Physical, chart, labs and discussed the procedure including the risks, benefits and alternatives for the proposed anesthesia with the patient or authorized representative who has indicated his/her understanding and acceptance.     Dental advisory given  Plan Discussed with:  CRNA  Anesthesia Plan Comments: (Plan Full PPE use  Plan GA (LMA) with GETA as needed d/w pt -WTP with same after Q&A)        Anesthesia Quick Evaluation

## 2019-09-17 NOTE — Discharge Instructions (Signed)
Pull packing out on Sunday 11/8    Anal Fistulotomy, Care After This sheet gives you information about how to care for yourself after your procedure. Your health care provider may also give you more specific instructions. If you have problems or questions, contact your health care provider. What can I expect after the procedure? After the procedure, it is common to have:  Some pain, discomfort, and swelling.  Increased pain during bowel movements.  Some bleeding from the wound. Follow these instructions at home: Medicines  Take over-the-counter and prescription medicines only as told by your health care provider.  If you were prescribed an antibiotic medicine, take it as told by your health care provider. Do not stop taking the antibiotic even if you start to feel better. Activity  Do not drive for 24 hours if you received a medicine to help you relax (sedative) during your procedure.  Do not drive or use heavy machinery while taking prescription pain medicine.  Do not lift anything that is heavier than the limit that your health care provider tells you until he or she says that it is safe.  Rest as told by your health care provider. It is also important to move around in between periods of rest. Get up and move around as often as you can tolerate.  Return to your normal activities as told by your health care provider. Ask your health care provider what activities are safe for you. Lifestyle  Limit alcohol intake to no more than 1 drink a day for nonpregnant women and 2 drinks a day for men. One drink equals 12 oz of beer, 5 oz of wine, or 1 oz of hard liquor.  Do not use any products that contain nicotine or tobacco, such as cigarettes and e-cigarettes. If you need help quitting, ask your health care provider. Incision care   Follow instructions from your health care provider about how to take care of your incision. ? If gauze (dressing) was placed in your incision during  surgery, you may be told to remove it yourself after a certain period of time. Make sure you wash your hands with soap and water before and after removing your dressing. If soap and water are not available, use hand sanitizer. ? Do not remove your dressing unless told by your health care provider. You may be told to allow the dressing to come out with your first bowel movement after surgery, instead of removing the dressing. ? Leave stitches (sutures), skin glue, or adhesive strips in place. These skin closures may need to stay in place for 2 weeks or longer. Do not remove adhesive strips completely unless your health care provider tells you to do that.  Keep the incision clean and dry.  Check your incision area every day for signs of infection. Check for: ? More redness, swelling, or pain. ? More fluid or blood. ? Warmth. ? Pus or a bad smell.  After having a bowel movement, clean the incision area using one of the following methods: ? Gently wipe with a moist towelette. ? Gently wipe with mild soap and water. ? Take a shower. ?   You may apply ice to the incision area to reduce discomfort: ? Put ice in a plastic bag. ? Place a towel between your skin and the bag. ? Leave the ice on for 20 minutes, 2-3 times a day. Eating and drinking  Follow instructions from your health care provider about eating or drinking restrictions.  Drink enough fluid  to keep your urine clear or pale yellow.  Eat lots of fiber. Fiber helps regulate bowel movements and prevent constipation. Foods that contain a lot of fiber include: ? Fruits. ? Vegetables. ? Whole grains. General instructions  Do not swim or use a hot tub until your health care provider says that this is safe.  If you have bleeding from your wound, wear an absorbent pad and change it often.  Wear compression stockings as told by your health care provider. These stockings help to prevent blood clots and reduce swelling in your  legs.  Keep all follow-up visits as told by your health care provider. This is important. Contact a health care provider if:  You have more redness, swelling, or pain around your incision area.  Your incision area feels warm to the touch or firm.  You have pus or a bad smell coming from your incision area.  You have a fever or chills.  You develop swelling or tenderness in your groin area.  You cannot control when you have bowel movements.  You are leaking stool (incontinent).  You have trouble urinating.  You have pain that does not get better with medicine. Get help right away if:  You have severe pain in your: ? Wound area. ? Abdomen.  You have sudden chest pain.  You become weak or you pass out (faint).  You have more fluid or blood coming from your incision.  You have bleeding from your incision that soaks 2 or more pads during 24 hours. This information is not intended to replace advice given to you by your health care provider. Make sure you discuss any questions you have with your health care provider. Document Released: 02/20/2016 Document Revised: 10/10/2017 Document Reviewed: 02/20/2016 Elsevier Patient Education  2020 [employer].

## 2019-09-17 NOTE — Transfer of Care (Signed)
Immediate Anesthesia Transfer of Care Note  Patient: Barry Ross  Procedure(s) Performed: ANAL FISTULOTOMY (N/A Rectum)  Patient Location: PACU  Anesthesia Type:General  Level of Consciousness: awake, alert , oriented and patient cooperative  Airway & Oxygen Therapy: Patient Spontanous Breathing  Post-op Assessment: Report given to RN and Post -op Vital signs reviewed and stable  Post vital signs: Reviewed and stable  Last Vitals:  Vitals Value Taken Time  BP 137/96 09/17/19 0805  Temp    Pulse 78 09/17/19 0807  Resp 13 09/17/19 0807  SpO2 95 % 09/17/19 0807  Vitals shown include unvalidated device data.  Last Pain:  Vitals:   09/17/19 0649  TempSrc: Oral  PainSc: 0-No pain      Patients Stated Pain Goal: 7 ([id number])  Complications: No apparent anesthesia complications

## 2019-09-17 NOTE — Interval H&P Note (Signed)
History and Physical Interval Note:  09/17/2019 7:05 AM  Barry Ross  has presented today for surgery, with the diagnosis of ANAL FISTULA.  The various methods of treatment have been discussed with the patient and family. After consideration of risks, benefits and other options for treatment, the patient has consented to  Procedure(s): ANAL FISTULOTOMY (N/A) as a surgical intervention.  The patient's history has been reviewed, patient examined, no change in status, stable for surgery.  I have reviewed the patient's chart and labs.  Questions were answered to the patient's satisfaction.     Aviva Signs

## 2019-09-17 NOTE — Anesthesia Procedure Notes (Signed)
Procedure Name: LMA Insertion Date/Time: 09/17/2019 7:23 AM Performed by: Ory Elting A, CRNA Pre-anesthesia Checklist: Patient identified, Emergency Drugs available, Suction available, Patient being monitored and Timeout performed Patient Re-evaluated:Patient Re-evaluated prior to induction Oxygen Delivery Method: Circle system utilized Preoxygenation: Pre-oxygenation with 100% oxygen Induction Type: IV induction LMA: LMA inserted LMA Size: 4.0 Number of attempts: 1 Placement Confirmation: positive ETCO2 and breath sounds checked- equal and bilateral Tube secured with: Tape Dental Injury: Teeth and Oropharynx as per pre-operative assessment

## 2019-09-17 NOTE — Op Note (Signed)
Patient:  Barry Ross  DOB:  26-Jun-1966  MRN:  [id number]   Preop Diagnosis: Anal fistula  Postop Diagnosis: Same  Procedure: Anal fistulotomy  Surgeon: Aviva Signs, MD  Anes: General  Indications: Patient is a 53 year old white male who presents with a perianal fistula extending up to the base of the scrotum.  The risks and benefits of the procedure including bleeding, infection, incontinence, and recurrence of the fistula were fully explained to the patient, who gave informed consent.  Procedure note: The patient was placed in the lithotomy position after general anesthesia was administered.  The perineum was prepped and draped using usual sterile technique with Betadine.  Surgical site confirmation was performed.  The patient had a fistula that extended from the 1 o'clock position at the anal verge subcutaneously to the base of the scrotum.  An incision was made at both open ends.  Granulation tissue was noted.  The tunnel was curetted of the granulation tissue.  Hydrogen peroxide was instilled and irrigated through the tunnel.  This did not involve the external sphincter mechanism.  Given its area and length, I did not feel that a complete fistulotomy would be beneficial for the patient as this would be a hard area to heal.  The anal verge opening was loosely approximated using 4-0 Monocryl sutures.  1/4 inch iodoform Nu Gauze was placed in the upper opening and into the fistulous tract.  Exparel was instilled into the surrounding perineum.  Rectal examination was otherwise unremarkable.  All tape and needle counts were correct at the end of the procedure.  The patient was awakened and transferred to PACU in stable condition.  Complications: None  EBL: Minimal  Specimen: None

## 2019-09-17 NOTE — Anesthesia Postprocedure Evaluation (Signed)
Anesthesia Post Note Late Entry for 0815  Patient: Barry Ross  Procedure(s) Performed: ANAL FISTULOTOMY (N/A Rectum)  Patient location during evaluation: PACU Anesthesia Type: General Level of consciousness: awake and alert and oriented Pain management: pain level controlled Vital Signs Assessment: post-procedure vital signs reviewed and stable Respiratory status: spontaneous breathing Cardiovascular status: stable Postop Assessment: no apparent nausea or vomiting Anesthetic complications: no     Last Vitals:  Vitals:   09/17/19 0830 09/17/19 0839  BP: 136/87 129/88  Pulse: 72 75  Resp: 17 17  Temp:    SpO2: 93% 98%    Last Pain:  Vitals:   09/17/19 0839  TempSrc: Oral  PainSc: 0-No pain                 Byanka Landrus A

## 2019-09-20 ENCOUNTER — Encounter (HOSPITAL_COMMUNITY): Payer: Self-pay | Admitting: General Surgery

## 2019-09-23 ENCOUNTER — Ambulatory Visit (INDEPENDENT_AMBULATORY_CARE_PROVIDER_SITE_OTHER): Payer: Self-pay | Admitting: General Surgery

## 2019-09-23 ENCOUNTER — Other Ambulatory Visit: Payer: Self-pay

## 2019-09-23 ENCOUNTER — Encounter: Payer: Self-pay | Admitting: General Surgery

## 2019-09-23 VITALS — BP 177/99 | HR 71 | Temp 98.1°F | Resp 16 | Ht 69.0 in | Wt 215.0 lb

## 2019-09-23 DIAGNOSIS — Z09 Encounter for follow-up examination after completed treatment for conditions other than malignant neoplasm: Secondary | ICD-10-CM

## 2019-09-23 NOTE — Progress Notes (Signed)
Subjective:     Barry Ross  Status post fistulotomy.  Patient doing well.  Packing has been removed.  Denies any pain. Objective:    BP (!) 177/99 (BP Location: Left Arm, Patient Position: Sitting, Cuff Size: Normal)   Pulse 71   Temp 98.1 F (36.7 C) (Oral)   Resp 16   Ht 5\' 9"  (1.753 m)   Wt 215 lb (97.5 kg)   SpO2 98%   BMI 31.75 kg/m   General:  alert, cooperative and no distress  Perineal wound healing well.  Silver nitrate placed in fistulous track from base of scrotum.  Perianal wound is closed.     Assessment:    Doing well postoperatively.    Plan:   Follow-up in 2 weeks for wound check.

## 2019-10-05 ENCOUNTER — Ambulatory Visit (INDEPENDENT_AMBULATORY_CARE_PROVIDER_SITE_OTHER): Payer: Self-pay | Admitting: General Surgery

## 2019-10-05 ENCOUNTER — Other Ambulatory Visit: Payer: Self-pay

## 2019-10-05 ENCOUNTER — Encounter: Payer: Self-pay | Admitting: General Surgery

## 2019-10-05 VITALS — BP 139/80 | HR 61 | Temp 98.0°F | Resp 16 | Ht 69.0 in | Wt 216.0 lb

## 2019-10-05 DIAGNOSIS — Z09 Encounter for follow-up examination after completed treatment for conditions other than malignant neoplasm: Secondary | ICD-10-CM

## 2019-10-05 NOTE — Progress Notes (Signed)
Subjective:     Barry Ross  Here for postoperative wound check.  Patient denies any significant drainage from the wound.  No fevers have been noted. Objective:    BP 139/80 (BP Location: Left Arm, Patient Position: Sitting, Cuff Size: Normal)   Pulse 61   Temp 98 F (36.7 C) (Oral)   Resp 16   Ht 5\' 9"  (1.753 m)   Wt 216 lb (98 kg)   SpO2 97%   BMI 31.90 kg/m   General:  alert, cooperative and no distress  Perineum healing well.  There still is a fistula that extends down from the base of the scrotum towards the anus, but it is closed at that point.  Silver nitrate was applied.  No purulent drainage present.     Assessment:    Doing well postoperatively. Perianal fistula healing by secondary intention.   Plan:   Follow-up in 2 weeks for wound check.

## 2019-10-19 ENCOUNTER — Ambulatory Visit (INDEPENDENT_AMBULATORY_CARE_PROVIDER_SITE_OTHER): Payer: Self-pay | Admitting: General Surgery

## 2019-10-19 ENCOUNTER — Other Ambulatory Visit: Payer: Self-pay

## 2019-10-19 ENCOUNTER — Encounter: Payer: Self-pay | Admitting: General Surgery

## 2019-10-19 VITALS — BP 165/89 | HR 69 | Temp 98.4°F | Resp 16 | Ht 69.0 in | Wt 216.0 lb

## 2019-10-19 DIAGNOSIS — Z09 Encounter for follow-up examination after completed treatment for conditions other than malignant neoplasm: Secondary | ICD-10-CM

## 2019-10-19 NOTE — Progress Notes (Signed)
Subjective:     Barry Ross  Here for postoperative visit.  Patient states the drainage had stopped until a few days ago when he started having yellowish drainage again.  He denies any fever or pain. Objective:    BP (!) 165/89 (BP Location: Left Arm, Patient Position: Sitting, Cuff Size: Normal)   Pulse 69   Temp 98.4 F (36.9 C) (Oral)   Resp 16   Ht 5\' 9"  (1.753 m)   Wt 216 lb (98 kg)   SpO2 97%   BMI 31.90 kg/m   General:  alert, cooperative and no distress  Perineal wound is healing well.  There is still a subcutaneous track extending towards the anal verge, though this is not open.  Silver nitrate was applied to the fistula.  The fistula does seem narrower in nature.     Assessment:    Status post fistulotomy with healing by secondary intention.    Plan:   We will see patient again in 2 weeks for follow-up.

## 2019-11-02 ENCOUNTER — Encounter: Payer: Self-pay | Admitting: General Surgery

## 2019-11-02 ENCOUNTER — Ambulatory Visit (INDEPENDENT_AMBULATORY_CARE_PROVIDER_SITE_OTHER): Payer: Self-pay | Admitting: General Surgery

## 2019-11-02 ENCOUNTER — Other Ambulatory Visit: Payer: Self-pay

## 2019-11-02 VITALS — BP 161/93 | HR 71 | Temp 98.4°F | Resp 16 | Ht 69.0 in | Wt 213.0 lb

## 2019-11-02 DIAGNOSIS — Z09 Encounter for follow-up examination after completed treatment for conditions other than malignant neoplasm: Secondary | ICD-10-CM

## 2019-11-02 NOTE — Progress Notes (Signed)
Subjective:     Barry Ross  Here for fistula wound check.  Patient states the wound intermittently drains clear yellow fluid.  He denies any fevers. Objective:    BP (!) 161/93 (BP Location: Left Arm, Patient Position: Sitting, Cuff Size: Normal)   Pulse 71   Temp 98.4 F (36.9 C) (Oral)   Resp 16   Ht 5\' 9"  (1.753 m)   Wt 213 lb (96.6 kg)   SpO2 96%   BMI 31.45 kg/m   General:  alert, cooperative and no distress  Perineal wound with small fistulous tract which does not end at the anus.  Silver nitrate was applied.  Minimal induration present.  No purulent drainage present.     Assessment:    Perianal fistula, slowly resolving    Plan:   I told the patient that should he continue to have the fistulous drainage, we may need to reoperate to try to excise the fistulous tract.  I will see him again in 2 weeks for follow-up.

## 2019-11-23 ENCOUNTER — Encounter: Payer: Self-pay | Admitting: General Surgery

## 2019-11-23 ENCOUNTER — Ambulatory Visit (INDEPENDENT_AMBULATORY_CARE_PROVIDER_SITE_OTHER): Payer: Self-pay | Admitting: General Surgery

## 2019-11-23 ENCOUNTER — Other Ambulatory Visit: Payer: Self-pay

## 2019-11-23 VITALS — BP 149/83 | HR 74 | Temp 98.4°F | Resp 16 | Ht 69.0 in | Wt 214.0 lb

## 2019-11-23 DIAGNOSIS — Z09 Encounter for follow-up examination after completed treatment for conditions other than malignant neoplasm: Secondary | ICD-10-CM

## 2019-11-23 NOTE — Progress Notes (Signed)
Subjective:     Barry Ross  Here for postoperative visit.  States he has not noted any significant change in the perianal fistula over the past month.  Does have intermittent clear yellow fluid draining from the upper portion of the fistula.  No air or stool present.  He denies any fevers. Objective:    BP (!) 149/83 (BP Location: Left Arm, Patient Position: Sitting, Cuff Size: Normal)   Pulse 74   Temp 98.4 F (36.9 C) (Oral)   Resp 16   Ht 5\' 9"  (1.753 m)   Wt 214 lb (97.1 kg)   SpO2 98%   BMI 31.60 kg/m   General:  alert, cooperative and no distress  Perianal fistula is present, with no connection to the rectum.  The fistula has not seemed to change in size since I last saw him.     Assessment:    Persistent perianal fistula    Plan:   It has been 2 months since his surgery.  We may be reaching a steady state with his healing.  As I have told him early on, he may need repeat surgery to address the persistence of the fistula.  He understands this and agrees.  I will see him in 1 month for follow-up.  May schedule surgery at that time.

## 2019-12-28 ENCOUNTER — Ambulatory Visit (INDEPENDENT_AMBULATORY_CARE_PROVIDER_SITE_OTHER): Payer: Managed Care, Other (non HMO) | Admitting: General Surgery

## 2019-12-28 ENCOUNTER — Ambulatory Visit: Payer: Managed Care, Other (non HMO) | Admitting: General Surgery

## 2019-12-28 VITALS — BP 149/82 | HR 68 | Temp 98.5°F | Ht 69.0 in | Wt 213.0 lb

## 2019-12-28 DIAGNOSIS — Z09 Encounter for follow-up examination after completed treatment for conditions other than malignant neoplasm: Secondary | ICD-10-CM

## 2019-12-29 ENCOUNTER — Encounter: Payer: Self-pay | Admitting: General Surgery

## 2019-12-29 NOTE — Progress Notes (Signed)
Subjective:     Barry Ross  Patient is here for follow-up of perianal fistula. Patient continues to have intermittent episodes of clear yellow drainage. No fevers have been noted. Objective:    BP (!) 149/82   Pulse 68   Temp 98.5 F (36.9 C)   Ht 5\' 9"  (1.753 m)   Wt 213 lb (96.6 kg)   BMI 31.45 kg/m   General:  alert, cooperative and no distress  Subcutaneous perianal fistulous tract seems to be almost resolved. He does have a larger opening towards the anus. Silver nitrate was applied. I was not able to tunnel the silver nitrate stick like I could in the past. No purulent drainage noted.     Assessment:    Perianal fistula, slowly resolving    Plan:   We will see patient again in 3 weeks for follow-up to see whether or not surgery is indicated. He is fine with that.

## 2020-01-18 ENCOUNTER — Encounter: Payer: Self-pay | Admitting: General Surgery

## 2020-01-18 ENCOUNTER — Other Ambulatory Visit: Payer: Self-pay

## 2020-01-18 ENCOUNTER — Ambulatory Visit (INDEPENDENT_AMBULATORY_CARE_PROVIDER_SITE_OTHER): Payer: Managed Care, Other (non HMO) | Admitting: General Surgery

## 2020-01-18 VITALS — BP 154/85 | HR 58 | Temp 98.2°F | Resp 12 | Ht 69.5 in | Wt 215.0 lb

## 2020-01-18 DIAGNOSIS — K603 Anal fistula: Secondary | ICD-10-CM | POA: Diagnosis not present

## 2020-01-18 NOTE — Progress Notes (Signed)
Subjective:     Barry Ross  Patient here for follow-up of perianal fistula.  States he has had no change in his condition since I last saw him. Objective:    BP (!) 154/85   Pulse (!) 58   Temp 98.2 F (36.8 C) (Oral)   Resp 12   Ht 5' 9.5" (1.765 m)   Wt 215 lb (97.5 kg)   SpO2 97%   BMI 31.29 kg/m   General:  alert, cooperative and no distress  Perineum shows an approximately 1.5 cm area of granulation tissue.  This does not look slightly larger than when I last saw him.  The fistulous tract to the anus seems to have resolved.     Assessment:    Perianal fistula    Plan:   Patient is scheduled for an anal fistulectomy on 02/04/20.  The risks and benefits of the procedure including bleeding, infection, and the possibly recurrence of the fistula were fully explained to the patient, who gave informed consent.

## 2020-01-19 NOTE — H&P (Signed)
Barry Ross is an 53 y.o. male.   Chief Complaint: Perianal fistula HPI: Patient is a 54 year old white male who I have been following for some time now with a persistent perianal fistula.  He first underwent a fistulectomy in October 2020.  He had previously undergone incision and drainage of a perirectal abscess and another facility in the past.  I was not able to fully unroofed the fistula given its location in the perineum.  It was debrided at the time of surgery and he was seen in my office multiple times for treatment of the fistula with silver nitrate sticks.  The fistulous tract has significantly receded, the mole there is a area of granulation tissue towards the anus.  He currently has no pain.  He has clear yellow drainage from this area which is slightly pink at times.  No stool with blood noted to be emanating from the fistula.  He currently has 0 out of 10 pain.  Past Medical History:  Diagnosis Date  . Allergic rhinitis   . Allergy   . Asthma   . Clotting disorder ([place])   . Depression    Patient denies  . GERD (gastroesophageal reflux disease)   . Hyperlipidemia     Past Surgical History:  Procedure Laterality Date  . anal cyst removal  2015  . ANAL FISTULOTOMY N/A 09/17/2019   Procedure: ANAL FISTULOTOMY;  Surgeon: Aviva Signs, MD;  Location: [facility];  Service: General;  Laterality: N/A;  . BALLOON DILATION  06/07/2011   Procedure: Larrie Kass DILATION;  Surgeon: Rogene Houston, MD;  Location: [facility];  Service: Endoscopy;  Laterality: N/A;  . ESOPHAGOGASTRODUODENOSCOPY  06/07/2011   Procedure: ESOPHAGOGASTRODUODENOSCOPY (EGD);  Surgeon: Rogene Houston, MD;  Location: [facility];  Service: Endoscopy;  Laterality: N/A;  . 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 Diabetes Other   . Heart disease Other   . Kidney disease Other   . Colon cancer Neg Hx   . Esophageal cancer Neg Hx   . Pancreatic cancer Neg Hx   .  Rectal cancer Neg Hx   . Stomach cancer Neg Hx    Social History:  reports that he has never smoked. His smokeless tobacco use includes snuff. He reports current alcohol use of about 13.0 standard drinks of alcohol per week. He reports that he does not use drugs.  Allergies:  Allergies  Allergen Reactions  . Other Other (See Comments)    IV IG hemoglobin, says he feels like his body is going to explode    No medications prior to admission.    No results found for this or any previous visit (from the past 48 hour(s)). No results found.  Review of Systems  Constitutional: Negative.   HENT: Negative.   Eyes: Negative.   Respiratory: Negative.   Cardiovascular: Negative.   Gastrointestinal: Negative.   Endocrine: Negative.   Genitourinary: Negative.   Musculoskeletal: Negative.   Allergic/Immunologic: Negative.   Neurological: Negative.   Hematological: Negative.   Psychiatric/Behavioral: Negative.     There were no vitals taken for this visit. Physical Exam  Vitals reviewed. Constitutional: He is oriented to person, place, and time. He appears well-developed and well-nourished. No distress.  HENT:  Head: Normocephalic and atraumatic.  Cardiovascular: Normal rate, regular rhythm and normal heart sounds. Exam reveals no gallop and no friction rub.  No murmur heard. Respiratory: Effort normal and  breath sounds normal. No respiratory distress. He has no wheezes. He has no rales.  Genitourinary:    Rectum normal.     Genitourinary Comments: Approximately 2 cm area of granulation tissue outside the anal verge towards the base of the scrotum and the perineum.  The previous incision site at the scrotum has healed.   Neurological: He is alert and oriented to person, place, and time.  Skin: Skin is warm and dry.     Assessment/Plan Impression: Persistent subcutaneous perianal fistula Plan: Patient will undergo a fistulectomy on 02/04/2020.  The risks and benefits of the  procedure including bleeding, infection, and recurrence of the fistula were fully explained to the patient, who gave informed consent.  Aviva Signs, MD 01/19/2020, 8:11 AM

## 2020-01-31 NOTE — Patient Instructions (Signed)
Barry Ross  01/31/2020     @PREFPERIOPPHARMACY @   Your procedure is scheduled on  02/04/2020   Report to [facility] at  [id number]  A.M.  Call this number if you have problems the morning of surgery:  [phone]   Remember:  Do not eat or drink after midnight.                      Take these medicines the morning of surgery with A SIP OF WATER  Amlodipine, lisinopril, protonix. Use your inhaler before you come.    Do not wear jewelry, make-up or nail polish.  Do not wear lotions, powders, or perfumes, or deodorant.  Do not shave 48 hours prior to surgery.  Men may shave face and neck.  Do not bring valuables to the hospital.  [facility] is not responsible for any belongings or valuables.  Contacts, dentures or bridgework may not be worn into surgery.  Leave your suitcase in the car.  After surgery it may be brought to your room.  For patients admitted to the hospital, discharge time will be determined by your treatment team.  Patients discharged the day of surgery will not be allowed to drive home.   Name and phone number of your driver:   family Special instructions:  DO NOT smoke the morning of your procedure.  Please read over the following fact sheets that you were given. Anesthesia Post-op Instructions and Care and Recovery After Surgery       Anal Fistulotomy, Care After This sheet gives you information about how to care for yourself after your procedure. Your health care provider may also give you more specific instructions. If you have problems or questions, contact your health care provider. What can I expect after the procedure? After the procedure, it is common to have:  Some pain, discomfort, and swelling.  Increased pain during bowel movements.  Some bleeding from the incision area.  Some leakage of stool. Follow these instructions at home: Medicines  Take over-the-counter and prescription medicines only as told by your health care  provider.  If you were prescribed an antibiotic medicine, take it as told by your health care provider. Do not stop taking the antibiotic even if you start to feel better.  Ask your health care provider if the medicine prescribed to you requires you to avoid driving or using heavy machinery. Incision care   Follow instructions from your health care provider about how to take care of your incision. Make sure you: ? Wash your hands with soap and water before and after you remove your gauze (dressing). If soap and water are not available, use hand sanitizer. ? Remove your dressing as told by your health care provider.  In some cases, you may be told not to remove the dressing, but to allow it to come out with your first bowel movement after surgery. ? Leave stitches (sutures), skin glue, or adhesive strips in place. These skin closures may need to stay in place for 2 weeks or longer. Do not remove adhesive strips completely unless your health care provider tells you to do that.  Keep the incision area clean and dry.  Check your incision area every day for signs of infection. Check for: ? More redness, swelling, or pain. ? More fluid or blood. ? Warmth. ? Pus or a bad smell. Self-care  After a bowel movement, clean the incision area using one  of the following methods: ? Gently wipe with a moist towelette. ? Gently wipe with mild soap and water. ? Take a shower. ? Take a sitz bath. This is a shallow, warm-water bath that attaches to the toilet bowl. You can also sit in a bathtub filled with warm water.  Do not swim or use a hot tub until your health care provider approves.  To reduce discomfort, you may apply ice to the incision area. To do this: ? Put ice in a plastic bag. ? Place a towel between your skin and the bag. ? Leave the ice on for 20 minutes, 2-3 times a day. Activity   Rest as told by your health care provider.  Avoid sitting for a long time without moving. Get up to  take short walks every 1-2 hours. This is important to improve blood flow and breathing. Ask for help if you feel weak or unsteady.  Do not drive for 24 hours if you were given a sedative during your procedure.  Do not lift anything that is heavier than 10 lb (4.5 kg), or the limit that you are told, until your health care provider says that it is safe.  Return to your normal activities as told by your health care provider. Ask your health care provider what activities are safe for you. Eating and drinking  Follow instructions from your health care provider about eating or drinking restrictions.  You may need to take these actions to prevent or treat constipation: ? Drink enough fluid to keep your urine pale yellow. ? Eat foods that are high in fiber, such as beans, whole grains, and fresh fruits and vegetables. ? Limit foods that are high in fat and processed sugars, such as fried or sweet foods. Lifestyle  Do not use any products that contain nicotine or tobacco, such as cigarettes, e-cigarettes, and chewing tobacco. If you need help quitting, ask your health care provider.  Do not drink alcohol if: ? Your health care provider tells you not to drink. ? You are pregnant, may be pregnant, or are planning to become pregnant.  If you drink alcohol: ? Limit how much you use to:  0-1 drink a day for women.  0-2 drinks a day for men. ? Be aware of how much alcohol is in your drink. In the [place] one drink equals one 12 oz bottle of beer (355 mL), one 5 oz glass of wine (148 mL), or one 1 oz glass of hard liquor (44 mL). General instructions  If you have bleeding from the incision area, wear a pad to absorb blood. Change it often.  Keep all follow-up visits as told by your health care provider. This is important. Contact a health care provider if:  You have any of these signs of infection: ? More redness, swelling, or pain around your incision area. ? Warmth coming from your incision  area, or your incision area is firm. ? Pus or a bad smell coming from your incision area. ? A fever or chills.  You develop swelling or tenderness in your groin area.  You cannot control your bowel movements (incontinence), or you are leaking stool.  You have trouble urinating.  You have pain that does not get better with medicine. Get help right away if:  You have severe pain in your abdomen or incision area.  You have sudden chest pain.  You become weak or you faint.  You have more fluid or blood coming from your incision.  You  have bleeding from your incision that soaks 2 or more pads during 24 hours. Summary  After anal fistulotomy, it is common to have increased pain during bowel movements and some bleeding.  If a dressing was placed in your incision during surgery, remove it as told by your health care provider.  It is important to keep the incision area clean and dry after each bowel movement.  If you have bleeding from the incision area, wear a pad to absorb blood. Change it often. This information is not intended to replace advice given to you by your health care provider. Make sure you discuss any questions you have with your health care provider. Document Revised: 04/12/2019 Document Reviewed: 04/12/2019 Elsevier Patient Education  [place] Anesthesia, Adult, Care After This sheet gives you information about how to care for yourself after your procedure. Your health care provider may also give you more specific instructions. If you have problems or questions, contact your health care provider. What can I expect after the procedure? After the procedure, the following side effects are common:  Pain or discomfort at the IV site.  Nausea.  Vomiting.  Sore throat.  Trouble concentrating.  Feeling cold or chills.  Weak or tired.  Sleepiness and fatigue.  Soreness and body aches. These side effects can affect parts of the body that were  not involved in surgery. Follow these instructions at home:  For at least 24 hours after the procedure:  Have a responsible adult stay with you. It is important to have someone help care for you until you are awake and alert.  Rest as needed.  Do not: ? Participate in activities in which you could fall or become injured. ? Drive. ? Use heavy machinery. ? Drink alcohol. ? Take sleeping pills or medicines that cause drowsiness. ? Make important decisions or sign legal documents. ? Take care of children on your own. Eating and drinking  Follow any instructions from your health care provider about eating or drinking restrictions.  When you feel hungry, start by eating small amounts of foods that are soft and easy to digest (bland), such as toast. Gradually return to your regular diet.  Drink enough fluid to keep your urine pale yellow.  If you vomit, rehydrate by drinking water, juice, or clear broth. General instructions  If you have sleep apnea, surgery and certain medicines can increase your risk for breathing problems. Follow instructions from your health care provider about wearing your sleep device: ? Anytime you are sleeping, including during daytime naps. ? While taking prescription pain medicines, sleeping medicines, or medicines that make you drowsy.  Return to your normal activities as told by your health care provider. Ask your health care provider what activities are safe for you.  Take over-the-counter and prescription medicines only as told by your health care provider.  If you smoke, do not smoke without supervision.  Keep all follow-up visits as told by your health care provider. This is important. Contact a health care provider if:  You have nausea or vomiting that does not get better with medicine.  You cannot eat or drink without vomiting.  You have pain that does not get better with medicine.  You are unable to pass urine.  You develop a skin  rash.  You have a fever.  You have redness around your IV site that gets worse. Get help right away if:  You have difficulty breathing.  You have chest pain.  You have blood  in your urine or stool, or you vomit blood. Summary  After the procedure, it is common to have a sore throat or nausea. It is also common to feel tired.  Have a responsible adult stay with you for the first 24 hours after general anesthesia. It is important to have someone help care for you until you are awake and alert.  When you feel hungry, start by eating small amounts of foods that are soft and easy to digest (bland), such as toast. Gradually return to your regular diet.  Drink enough fluid to keep your urine pale yellow.  Return to your normal activities as told by your health care provider. Ask your health care provider what activities are safe for you. This information is not intended to replace advice given to you by your health care provider. Make sure you discuss any questions you have with your health care provider. Document Revised: 10/31/2017 Document Reviewed: 06/13/2017 Elsevier Patient Education  [place]. How to Use Chlorhexidine for Bathing Chlorhexidine gluconate (CHG) is a germ-killing (antiseptic) solution that is used to clean the skin. It can get rid of the bacteria that normally live on the skin and can keep them away for about 24 hours. To clean your skin with CHG, you may be given:  A CHG solution to use in the shower or as part of a sponge bath.  A prepackaged cloth that contains CHG. Cleaning your skin with CHG may help lower the risk for infection:  While you are staying in the intensive care unit of the hospital.  If you have a vascular access, such as a central line, to provide short-term or long-term access to your veins.  If you have a catheter to drain urine from your bladder.  If you are on a ventilator. A ventilator is a machine that helps you breathe by  moving air in and out of your lungs.  After surgery. What are the risks? Risks of using CHG include:  A skin reaction.  Hearing loss, if CHG gets in your ears.  Eye injury, if CHG gets in your eyes and is not rinsed out.  The CHG product catching fire. Make sure that you avoid smoking and flames after applying CHG to your skin. Do not use CHG:  If you have a chlorhexidine allergy or have previously reacted to chlorhexidine.  On babies younger than 69 months of age. How to use CHG solution  Use CHG only as told by your health care provider, and follow the instructions on the label.  Use the full amount of CHG as directed. Usually, this is one bottle. During a shower Follow these steps when using CHG solution during a shower (unless your health care provider gives you different instructions): 1. Start the shower. 2. Use your normal soap and shampoo to wash your face and hair. 3. Turn off the shower or move out of the shower stream. 4. Pour the CHG onto a clean washcloth. Do not use any type of brush or rough-edged sponge. 5. Starting at your neck, lather your body down to your toes. Make sure you follow these instructions: ? If you will be having surgery, pay special attention to the part of your body where you will be having surgery. Scrub this area for at least 1 minute. ? Do not use CHG on your head or face. If the solution gets into your ears or eyes, rinse them well with water. ? Avoid your genital area. ? Avoid any areas  of skin that have broken skin, cuts, or scrapes. ? Scrub your back and under your arms. Make sure to wash skin folds. 6. Let the lather sit on your skin for 1-2 minutes or as long as told by your health care provider. 7. Thoroughly rinse your entire body in the shower. Make sure that all body creases and crevices are rinsed well. 8. Dry off with a clean towel. Do not put any substances on your body afterward--such as powder, lotion, or perfume--unless you are  told to do so by your health care provider. Only use lotions that are recommended by the manufacturer. 9. Put on clean clothes or pajamas. 10. If it is the night before your surgery, sleep in clean sheets.  During a sponge bath Follow these steps when using CHG solution during a sponge bath (unless your health care provider gives you different instructions): 1. Use your normal soap and shampoo to wash your face and hair. 2. Pour the CHG onto a clean washcloth. 3. Starting at your neck, lather your body down to your toes. Make sure you follow these instructions: ? If you will be having surgery, pay special attention to the part of your body where you will be having surgery. Scrub this area for at least 1 minute. ? Do not use CHG on your head or face. If the solution gets into your ears or eyes, rinse them well with water. ? Avoid your genital area. ? Avoid any areas of skin that have broken skin, cuts, or scrapes. ? Scrub your back and under your arms. Make sure to wash skin folds. 4. Let the lather sit on your skin for 1-2 minutes or as long as told by your health care provider. 5. Using a different clean, wet washcloth, thoroughly rinse your entire body. Make sure that all body creases and crevices are rinsed well. 6. Dry off with a clean towel. Do not put any substances on your body afterward--such as powder, lotion, or perfume--unless you are told to do so by your health care provider. Only use lotions that are recommended by the manufacturer. 7. Put on clean clothes or pajamas. 8. If it is the night before your surgery, sleep in clean sheets. How to use CHG prepackaged cloths  Only use CHG cloths as told by your health care provider, and follow the instructions on the label.  Use the CHG cloth on clean, dry skin.  Do not use the CHG cloth on your head or face unless your health care provider tells you to.  When washing with the CHG cloth: ? Avoid your genital area. ? Avoid any areas  of skin that have broken skin, cuts, or scrapes. Before surgery Follow these steps when using a CHG cloth to clean before surgery (unless your health care provider gives you different instructions): 1. Using the CHG cloth, vigorously scrub the part of your body where you will be having surgery. Scrub using a back-and-forth motion for 3 minutes. The area on your body should be completely wet with CHG when you are done scrubbing. 2. Do not rinse. Discard the cloth and let the area air-dry. Do not put any substances on the area afterward, such as powder, lotion, or perfume. 3. Put on clean clothes or pajamas. 4. If it is the night before your surgery, sleep in clean sheets.  For general bathing Follow these steps when using CHG cloths for general bathing (unless your health care provider gives you different instructions). 1. Use a separate  CHG cloth for each area of your body. Make sure you wash between any folds of skin and between your fingers and toes. Wash your body in the following order, switching to a new cloth after each step: ? The front of your neck, shoulders, and chest. ? Both of your arms, under your arms, and your hands. ? Your stomach and groin area, avoiding the genitals. ? Your right leg and foot. ? Your left leg and foot. ? The back of your neck, your back, and your buttocks. 2. Do not rinse. Discard the cloth and let the area air-dry. Do not put any substances on your body afterward--such as powder, lotion, or perfume--unless you are told to do so by your health care provider. Only use lotions that are recommended by the manufacturer. 3. Put on clean clothes or pajamas. Contact a health care provider if:  Your skin gets irritated after scrubbing.  You have questions about using your solution or cloth. Get help right away if:  Your eyes become very red or swollen.  Your eyes itch badly.  Your skin itches badly and is red or swollen.  Your hearing changes.  You have  trouble seeing.  You have swelling or tingling in your mouth or throat.  You have trouble breathing.  You swallow any chlorhexidine. Summary  Chlorhexidine gluconate (CHG) is a germ-killing (antiseptic) solution that is used to clean the skin. Cleaning your skin with CHG may help to lower your risk for infection.  You may be given CHG to use for bathing. It may be in a bottle or in a prepackaged cloth to use on your skin. Carefully follow your health care provider's instructions and the instructions on the product label.  Do not use CHG if you have a chlorhexidine allergy.  Contact your health care provider if your skin gets irritated after scrubbing. This information is not intended to replace advice given to you by your health care provider. Make sure you discuss any questions you have with your health care provider. Document Revised: 01/14/2019 Document Reviewed: 09/25/2017 Elsevier Patient Education  [place].

## 2020-02-02 ENCOUNTER — Encounter (HOSPITAL_COMMUNITY)
Admission: RE | Admit: 2020-02-02 | Discharge: 2020-02-02 | Disposition: A | Discharge status: Home / Self Care | Payer: Managed Care, Other (non HMO) | Source: Ambulatory Visit | Attending: General Surgery | Admitting: General Surgery

## 2020-02-02 ENCOUNTER — Other Ambulatory Visit: Payer: Self-pay

## 2020-02-02 ENCOUNTER — Other Ambulatory Visit (HOSPITAL_COMMUNITY)
Admission: RE | Admit: 2020-02-02 | Discharge: 2020-02-02 | Disposition: A | Discharge status: Home / Self Care | Payer: Managed Care, Other (non HMO) | Source: Ambulatory Visit | Attending: General Surgery | Admitting: General Surgery

## 2020-02-02 ENCOUNTER — Encounter (HOSPITAL_COMMUNITY): Payer: Self-pay

## 2020-02-02 DIAGNOSIS — F17290 Nicotine dependence, other tobacco product, uncomplicated: Secondary | ICD-10-CM | POA: Diagnosis not present

## 2020-02-02 DIAGNOSIS — Z20822 Contact with and (suspected) exposure to covid-19: Secondary | ICD-10-CM | POA: Diagnosis not present

## 2020-02-02 DIAGNOSIS — K219 Gastro-esophageal reflux disease without esophagitis: Secondary | ICD-10-CM | POA: Diagnosis not present

## 2020-02-02 DIAGNOSIS — K604 Rectal fistula: Secondary | ICD-10-CM | POA: Diagnosis not present

## 2020-02-02 DIAGNOSIS — K603 Anal fistula: Secondary | ICD-10-CM | POA: Diagnosis present

## 2020-02-02 LAB — CBC WITH DIFFERENTIAL/PLATELET
Abs Immature Granulocytes: 0.03 10*3/uL (ref 0.00–0.07)
Basophils Absolute: 0.1 10*3/uL (ref 0.0–0.1)
Basophils Relative: 1 %
Eosinophils Absolute: 0.6 10*3/uL — ABNORMAL HIGH (ref 0.0–0.5)
Eosinophils Relative: 5 %
HCT: 42.5 % (ref 39.0–52.0)
Hemoglobin: 14.1 g/dL (ref 13.0–17.0)
Immature Granulocytes: 0 %
Lymphocytes Relative: 37 %
Lymphs Abs: 4 10*3/uL (ref 0.7–4.0)
MCH: 30.9 pg (ref 26.0–34.0)
MCHC: 33.2 g/dL (ref 30.0–36.0)
MCV: 93.2 fL (ref 80.0–100.0)
Monocytes Absolute: 1.5 10*3/uL — ABNORMAL HIGH (ref 0.1–1.0)
Monocytes Relative: 14 %
Neutro Abs: 4.6 10*3/uL (ref 1.7–7.7)
Neutrophils Relative %: 43 %
Platelets: 409 10*3/uL — ABNORMAL HIGH (ref 150–400)
RBC: 4.56 MIL/uL (ref 4.22–5.81)
RDW: 14.5 % (ref 11.5–15.5)
WBC: 10.8 10*3/uL — ABNORMAL HIGH (ref 4.0–10.5)
nRBC: 0 % (ref 0.0–0.2)

## 2020-02-02 LAB — BASIC METABOLIC PANEL
Anion gap: 10 (ref 5–15)
BUN: 18 mg/dL (ref 6–20)
CO2: 25 mmol/L (ref 22–32)
Calcium: 9.4 mg/dL (ref 8.9–10.3)
Chloride: 104 mmol/L (ref 98–111)
Creatinine, Ser: 0.97 mg/dL (ref 0.61–1.24)
GFR calc Af Amer: >60 mL/min (ref >60)
GFR calc non Af Amer: >60 mL/min (ref >60)
Glucose, Bld: 88 mg/dL (ref 70–99)
Potassium: 3.6 mmol/L (ref 3.5–5.1)
Sodium: 139 mmol/L (ref 135–145)

## 2020-02-02 LAB — SARS CORONAVIRUS 2 (TAT 6-24 HRS): SARS Coronavirus 2: NEGATIVE

## 2020-02-04 ENCOUNTER — Encounter (HOSPITAL_COMMUNITY): Payer: Self-pay | Admitting: General Surgery

## 2020-02-04 ENCOUNTER — Ambulatory Visit (HOSPITAL_COMMUNITY)
Admission: RE | Admit: 2020-02-04 | Discharge: 2020-02-04 | Disposition: A | Discharge status: Home / Self Care | Payer: Managed Care, Other (non HMO) | Attending: General Surgery | Admitting: General Surgery

## 2020-02-04 ENCOUNTER — Encounter (HOSPITAL_COMMUNITY)
Admission: RE | Disposition: A | Discharge status: Home / Self Care | Payer: Self-pay | Source: Home / Self Care | Attending: General Surgery

## 2020-02-04 ENCOUNTER — Ambulatory Visit (HOSPITAL_COMMUNITY): Payer: Managed Care, Other (non HMO) | Admitting: Anesthesiology

## 2020-02-04 DIAGNOSIS — Z20822 Contact with and (suspected) exposure to covid-19: Secondary | ICD-10-CM | POA: Insufficient documentation

## 2020-02-04 DIAGNOSIS — K604 Rectal fistula: Secondary | ICD-10-CM | POA: Insufficient documentation

## 2020-02-04 DIAGNOSIS — N360 Urethral fistula: Secondary | ICD-10-CM

## 2020-02-04 DIAGNOSIS — F17290 Nicotine dependence, other tobacco product, uncomplicated: Secondary | ICD-10-CM | POA: Insufficient documentation

## 2020-02-04 DIAGNOSIS — K219 Gastro-esophageal reflux disease without esophagitis: Secondary | ICD-10-CM | POA: Insufficient documentation

## 2020-02-04 HISTORY — PX: FISSURECTOMY: SHX5244

## 2020-02-04 SURGERY — FISSURECTOMY, ANAL
Anesthesia: General | Site: Rectum

## 2020-02-04 MED ORDER — GLYCOPYRROLATE PF 0.2 MG/ML IJ SOSY
PREFILLED_SYRINGE | INTRAMUSCULAR | Status: AC
  Filled 2020-02-04: qty 1

## 2020-02-04 MED ORDER — KETOROLAC TROMETHAMINE 30 MG/ML IJ SOLN
INTRAMUSCULAR | Status: AC
  Filled 2020-02-04: qty 1

## 2020-02-04 MED ORDER — ONDANSETRON HCL 4 MG/2ML IJ SOLN
INTRAMUSCULAR | Status: DC | PRN
  Administered 2020-02-04: 4 mg via INTRAVENOUS

## 2020-02-04 MED ORDER — SODIUM CHLORIDE 0.9 % IV SOLN
INTRAVENOUS | Status: AC
  Filled 2020-02-04: qty 2

## 2020-02-04 MED ORDER — LIDOCAINE VISCOUS HCL 2 % MT SOLN
OROMUCOSAL | Status: AC
  Filled 2020-02-04: qty 15

## 2020-02-04 MED ORDER — LIDOCAINE HCL (CARDIAC) PF 50 MG/5ML IV SOSY
PREFILLED_SYRINGE | INTRAVENOUS | Status: DC | PRN
  Administered 2020-02-04: 50 mg via INTRAVENOUS

## 2020-02-04 MED ORDER — FENTANYL CITRATE (PF) 100 MCG/2ML IJ SOLN: 25.0000 ug | INTRAMUSCULAR | Status: DC | PRN

## 2020-02-04 MED ORDER — CHLORHEXIDINE GLUCONATE CLOTH 2 % EX PADS: 6.0000 | MEDICATED_PAD | Freq: Once | CUTANEOUS | Status: DC

## 2020-02-04 MED ORDER — LACTATED RINGERS IV SOLN: INTRAVENOUS | Status: DC

## 2020-02-04 MED ORDER — PROPOFOL 10 MG/ML IV BOLUS
INTRAVENOUS | Status: DC | PRN
  Administered 2020-02-04: 250 mg via INTRAVENOUS

## 2020-02-04 MED ORDER — SODIUM CHLORIDE 0.9 % IR SOLN
Status: DC | PRN
  Administered 2020-02-04: 1000 mL

## 2020-02-04 MED ORDER — SODIUM CHLORIDE 0.9 % IV SOLN
2.0000 g | INTRAVENOUS | Status: AC
  Administered 2020-02-04: 2 g via INTRAVENOUS

## 2020-02-04 MED ORDER — FENTANYL CITRATE (PF) 100 MCG/2ML IJ SOLN
INTRAMUSCULAR | Status: DC | PRN
  Administered 2020-02-04: 75 ug via INTRAVENOUS
  Administered 2020-02-04: 25 ug via INTRAVENOUS

## 2020-02-04 MED ORDER — KETOROLAC TROMETHAMINE 30 MG/ML IJ SOLN
30.0000 mg | Freq: Once | INTRAMUSCULAR | Status: AC
  Administered 2020-02-04: 10:00:00 30 mg via INTRAVENOUS

## 2020-02-04 MED ORDER — LIDOCAINE 2% (20 MG/ML) 5 ML SYRINGE
INTRAMUSCULAR | Status: AC
  Filled 2020-02-04: qty 10

## 2020-02-04 MED ORDER — BUPIVACAINE LIPOSOME 1.3 % IJ SUSP
INTRAMUSCULAR | Status: DC | PRN
  Administered 2020-02-04: 10 mL

## 2020-02-04 MED ORDER — MIDAZOLAM HCL 5 MG/5ML IJ SOLN
INTRAMUSCULAR | Status: DC | PRN
  Administered 2020-02-04: 2 mg via INTRAVENOUS

## 2020-02-04 MED ORDER — MIDAZOLAM HCL 2 MG/2ML IJ SOLN
INTRAMUSCULAR | Status: AC
  Filled 2020-02-04: qty 2

## 2020-02-04 MED ORDER — FENTANYL CITRATE (PF) 250 MCG/5ML IJ SOLN
INTRAMUSCULAR | Status: AC
  Filled 2020-02-04: qty 5

## 2020-02-04 MED ORDER — GLYCOPYRROLATE 0.2 MG/ML IJ SOLN
INTRAMUSCULAR | Status: DC | PRN
  Administered 2020-02-04: .2 mg via INTRAVENOUS

## 2020-02-04 MED ORDER — OXYCODONE-ACETAMINOPHEN 7.5-325 MG PO TABS: 1.0000 | ORAL_TABLET | Freq: Four times a day (QID) | ORAL | 0 refills | Status: DC | PRN

## 2020-02-04 MED ORDER — PROPOFOL 10 MG/ML IV BOLUS
INTRAVENOUS | Status: AC
  Filled 2020-02-04: qty 20

## 2020-02-04 MED ORDER — BUPIVACAINE LIPOSOME 1.3 % IJ SUSP
INTRAMUSCULAR | Status: AC
  Filled 2020-02-04: qty 20

## 2020-02-04 MED ORDER — ONDANSETRON HCL 4 MG/2ML IJ SOLN
INTRAMUSCULAR | Status: AC
  Filled 2020-02-04: qty 2

## 2020-02-04 MED ORDER — EPHEDRINE SULFATE 50 MG/ML IJ SOLN
INTRAMUSCULAR | Status: DC | PRN
  Administered 2020-02-04: 10 mg via INTRAVENOUS

## 2020-02-04 SURGICAL SUPPLY — 27 items
CLOTH BEACON ORANGE TIMEOUT ST (SAFETY) ×3 IMPLANT
COVER LIGHT HANDLE STERIS (MISCELLANEOUS) ×6 IMPLANT
COVER WAND RF STERILE (DRAPES) ×3 IMPLANT
DERMABOND ADVANCED (GAUZE/BANDAGES/DRESSINGS) ×2
DERMABOND ADVANCED .7 DNX12 (GAUZE/BANDAGES/DRESSINGS) ×1 IMPLANT
DRAPE HALF SHEET 40X57 (DRAPES) ×3 IMPLANT
ELECT REM PT RETURN 9FT ADLT (ELECTROSURGICAL) ×3
ELECTRODE REM PT RTRN 9FT ADLT (ELECTROSURGICAL) ×1 IMPLANT
GAUZE SPONGE 4X4 12PLY STRL (GAUZE/BANDAGES/DRESSINGS) ×6 IMPLANT
GLOVE BIO SURGEON STRL SZ7 (GLOVE) ×3 IMPLANT
GLOVE BIOGEL PI IND STRL 7.0 (GLOVE) ×2 IMPLANT
GLOVE BIOGEL PI INDICATOR 7.0 (GLOVE) ×4
GLOVE SURG SS PI 7.5 STRL IVOR (GLOVE) ×3 IMPLANT
GOWN STRL REUS W/TWL LRG LVL3 (GOWN DISPOSABLE) ×6 IMPLANT
HEMOSTAT SURGICEL 4X8 (HEMOSTASIS) IMPLANT
KIT TURNOVER KIT A (KITS) ×3 IMPLANT
MANIFOLD NEPTUNE II (INSTRUMENTS) ×3 IMPLANT
NEEDLE HYPO 18GX1.5 BLUNT FILL (NEEDLE) ×3 IMPLANT
NEEDLE HYPO 22GX1.5 SAFETY (NEEDLE) ×3 IMPLANT
NS IRRIG 1000ML POUR BTL (IV SOLUTION) ×3 IMPLANT
PACK PERI GYN (CUSTOM PROCEDURE TRAY) ×3 IMPLANT
PAD ARMBOARD 7.5X6 YLW CONV (MISCELLANEOUS) ×3 IMPLANT
SET BASIN LINEN APH (SET/KITS/TRAYS/PACK) ×3 IMPLANT
SUT MNCRL AB 4-0 PS2 18 (SUTURE) ×3 IMPLANT
SUT VIC AB 3-0 SH 27 (SUTURE) ×3
SUT VIC AB 3-0 SH 27X BRD (SUTURE) ×1 IMPLANT
SYR 20ML LL LF (SYRINGE) ×6 IMPLANT

## 2020-02-04 NOTE — Progress Notes (Signed)
Per Dr. Briant Cedar patient can use his inhaler if has it, or can do a breathing treatment here at hospital. Advised patient and patient states, "I feel fine, I just wanted to let you know that it was a little tight when I took the first deep breath".  Patient has "my inhaler at home, will use if it happens again".  Patient "ready to go eat".   Patient advised per Dr. Briant Cedar if develops shortness of breath , chest pain, difficulty breathing to come to Emergency for evaluation. Wife given instructions at discharge and patient and wife verbalized understanding of instructions.

## 2020-02-04 NOTE — Discharge Instructions (Signed)
No tub bathes.   Please leave Exparel bracelet on until Tuesday March 30th, 2021. No further numbing medication until March 30th   Anal Fistulotomy, Care After This sheet gives you information about how to care for yourself after your procedure. Your health care provider may also give you more specific instructions. If you have problems or questions, contact your health care provider. What can I expect after the procedure? After the procedure, it is common to have:  Some pain, discomfort, and swelling.  Increased pain during bowel movements.  Some bleeding from the incision area.  Some leakage of stool. Follow these instructions at home: Medicines  Take over-the-counter and prescription medicines only as told by your health care provider.  If you were prescribed an antibiotic medicine, take it as told by your health care provider. Do not stop taking the antibiotic even if you start to feel better.  Ask your health care provider if the medicine prescribed to you requires you to avoid driving or using heavy machinery. Incision care   Follow instructions from your health care provider about how to take care of your incision. Make sure you: ? Wash your hands with soap and water before and after you remove your gauze (dressing). If soap and water are not available, use hand sanitizer. ? Remove your dressing as told by your health care provider.  In some cases, you may be told not to remove the dressing, but to allow it to come out with your first bowel movement after surgery. ? Leave stitches (sutures), skin glue, or adhesive strips in place. These skin closures may need to stay in place for 2 weeks or longer. Do not remove adhesive strips completely unless your health care provider tells you to do that.  Keep the incision area clean and dry.  Check your incision area every day for signs of infection. Check for: ? More redness, swelling, or pain. ? More fluid or  blood. ? Warmth. ? Pus or a bad smell. Self-care  After a bowel movement, clean the incision area using one of the following methods: ? Gently wipe with a moist towelette. ? Gently wipe with mild soap and water. ? Take a shower. ? Take a sitz bath. This is a shallow, warm-water bath that attaches to the toilet bowl. You can also sit in a bathtub filled with warm water.  Do not swim or use a hot tub until your health care provider approves.  To reduce discomfort, you may apply ice to the incision area. To do this: ? Put ice in a plastic bag. ? Place a towel between your skin and the bag. ? Leave the ice on for 20 minutes, 2-3 times a day. Activity   Rest as told by your health care provider.  Avoid sitting for a long time without moving. Get up to take short walks every 1-2 hours. This is important to improve blood flow and breathing. Ask for help if you feel weak or unsteady.  Do not drive for 24 hours if you were given a sedative during your procedure.  Do not lift anything that is heavier than 10 lb (4.5 kg), or the limit that you are told, until your health care provider says that it is safe.  Return to your normal activities as told by your health care provider. Ask your health care provider what activities are safe for you. Eating and drinking  Follow instructions from your health care provider about eating or drinking restrictions.  You may  need to take these actions to prevent or treat constipation: ? Drink enough fluid to keep your urine pale yellow. ? Eat foods that are high in fiber, such as beans, whole grains, and fresh fruits and vegetables. ? Limit foods that are high in fat and processed sugars, such as fried or sweet foods. Lifestyle  Do not use any products that contain nicotine or tobacco, such as cigarettes, e-cigarettes, and chewing tobacco. If you need help quitting, ask your health care provider.  Do not drink alcohol if: ? Your health care provider  tells you not to drink. ? You are pregnant, may be pregnant, or are planning to become pregnant.  If you drink alcohol: ? Limit how much you use to:  0-1 drink a day for women.  0-2 drinks a day for men. ? Be aware of how much alcohol is in your drink. In the [place] one drink equals one 12 oz bottle of beer (355 mL), one 5 oz glass of wine (148 mL), or one 1 oz glass of hard liquor (44 mL). General instructions  If you have bleeding from the incision area, wear a pad to absorb blood. Change it often.  Keep all follow-up visits as told by your health care provider. This is important. Contact a health care provider if:  You have any of these signs of infection: ? More redness, swelling, or pain around your incision area. ? Warmth coming from your incision area, or your incision area is firm. ? Pus or a bad smell coming from your incision area. ? A fever or chills.  You develop swelling or tenderness in your groin area.  You cannot control your bowel movements (incontinence), or you are leaking stool.  You have trouble urinating.  You have pain that does not get better with medicine. Get help right away if:  You have severe pain in your abdomen or incision area.  You have sudden chest pain.  You become weak or you faint.  You have more fluid or blood coming from your incision.  You have bleeding from your incision that soaks 2 or more pads during 24 hours. Summary  After anal fistulotomy, it is common to have increased pain during bowel movements and some bleeding.  If a dressing was placed in your incision during surgery, remove it as told by your health care provider.  It is important to keep the incision area clean and dry after each bowel movement.  If you have bleeding from the incision area, wear a pad to absorb blood. Change it often. This information is not intended to replace advice given to you by your health care provider. Make sure you discuss any questions  you have with your health care provider. Document Revised: 04/12/2019 Document Reviewed: 04/12/2019 Elsevier Patient Education  2020 [place] After These instructions provide you with information about caring for yourself after your procedure. Your health care provider may also give you more specific instructions. Your treatment has been planned according to current medical practices, but problems sometimes occur. Call your health care provider if you have any problems or questions after your procedure. What can I expect after the procedure? After your procedure, you may:  Feel sleepy for several hours.  Feel clumsy and have poor balance for several hours.  Feel forgetful about what happened after the procedure.  Have poor judgment for several hours.  Feel nauseous or vomit.  Have a sore throat if you had  a breathing tube during the procedure. Follow these instructions at home: For at least 24 hours after the procedure:      Have a responsible adult stay with you. It is important to have someone help care for you until you are awake and alert.  Rest as needed.  Do not: ? Participate in activities in which you could fall or become injured. ? Drive. ? Use heavy machinery. ? Drink alcohol. ? Take sleeping pills or medicines that cause drowsiness. ? Make important decisions or sign legal documents. ? Take care of children on your own. Eating and drinking  Follow the diet that is recommended by your health care provider.  If you vomit, drink water, juice, or soup when you can drink without vomiting.  Make sure you have little or no nausea before eating solid foods. General instructions  Take over-the-counter and prescription medicines only as told by your health care provider.  If you have sleep apnea, surgery and certain medicines can increase your risk for breathing problems. Follow instructions from your health care provider  about wearing your sleep device: ? Anytime you are sleeping, including during daytime naps. ? While taking prescription pain medicines, sleeping medicines, or medicines that make you drowsy.  If you smoke, do not smoke without supervision.  Keep all follow-up visits as told by your health care provider. This is important. Contact a health care provider if:  You keep feeling nauseous or you keep vomiting.  You feel light-headed.  You develop a rash.  You have a fever. Get help right away if:  You have trouble breathing. Summary  For several hours after your procedure, you may feel sleepy and have poor judgment.  Have a responsible adult stay with you for at least 24 hours or until you are awake and alert. This information is not intended to replace advice given to you by your health care provider. Make sure you discuss any questions you have with your health care provider. Document Revised: 01/26/2018 Document Reviewed: 02/18/2016 Elsevier Patient Education  [place].

## 2020-02-04 NOTE — Anesthesia Preprocedure Evaluation (Signed)
Anesthesia Evaluation  Patient identified by MRN, date of birth, ID band Patient awake    Reviewed: Allergy & Precautions, H&P , NPO status , Patient's Chart, lab work & pertinent test results  Airway        Dental   Pulmonary asthma ,    breath sounds clear to auscultation       Cardiovascular negative cardio ROS   Rhythm:regular Rate:Normal     Neuro/Psych PSYCHIATRIC DISORDERS Depression negative neurological ROS     GI/Hepatic Neg liver ROS, GERD  Medicated,  Endo/Other  negative endocrine ROS  Renal/GU negative Renal ROS  negative genitourinary   Musculoskeletal   Abdominal   Peds  Hematology negative hematology ROS (+)   Anesthesia Other Findings   Reproductive/Obstetrics negative OB ROS                             Anesthesia Physical Anesthesia Plan  ASA: II  Anesthesia Plan: General   Post-op Pain Management:    Induction:   PONV Risk Score and Plan: 1 and Ondansetron  Airway Management Planned:   Additional Equipment:   Intra-op Plan:   Post-operative Plan:   Informed Consent: I have reviewed the patients History and Physical, chart, labs and discussed the procedure including the risks, benefits and alternatives for the proposed anesthesia with the patient or authorized representative who has indicated his/her understanding and acceptance.       Plan Discussed with: Surgeon and CRNA  Anesthesia Plan Comments:         Anesthesia Quick Evaluation

## 2020-02-04 NOTE — Transfer of Care (Signed)
Immediate Anesthesia Transfer of Care Note  Patient: Barry Ross  Procedure(s) Performed: FISTULECTOMY (N/A Rectum)  Patient Location: PACU  Anesthesia Type:General  Level of Consciousness: awake  Airway & Oxygen Therapy: Patient Spontanous Breathing  Post-op Assessment: Report given to RN  Post vital signs: Reviewed  Last Vitals:  Vitals Value Taken Time  BP 115/75 02/04/20 0940  Temp    Pulse 82 02/04/20 0943  Resp 16 02/04/20 0943  SpO2 92 % 02/04/20 0943  Vitals shown include unvalidated device data.  Last Pain:  Vitals:   02/04/20 0738  TempSrc: Oral  PainSc: 3       Patients Stated Pain Goal: 4 ([id number])  Complications: No apparent anesthesia complications

## 2020-02-04 NOTE — Anesthesia Procedure Notes (Signed)
Procedure Name: LMA Insertion Date/Time: 02/04/2020 8:56 AM Performed by: Ollen Bowl, CRNA Pre-anesthesia Checklist: Patient identified, Patient being monitored, Emergency Drugs available, Timeout performed and Suction available Patient Re-evaluated:Patient Re-evaluated prior to induction Oxygen Delivery Method: Circle System Utilized Preoxygenation: Pre-oxygenation with 100% oxygen Induction Type: IV induction Ventilation: Mask ventilation without difficulty LMA: LMA inserted LMA Size: 4.0 Number of attempts: 1 Placement Confirmation: positive ETCO2 and breath sounds checked- equal and bilateral

## 2020-02-04 NOTE — Op Note (Signed)
Patient:  Barry Ross  DOB:  [birth year]  MRN:  [id number]   Preop Diagnosis: Perineal fistula  Postop Diagnosis: Same  Procedure: Fistulectomy  Surgeon: Aviva Signs, MD  Anes: General  Indications: Patient is a 54 year old white male who has had multiple procedures for perirectal abscess who now presents for a fistulectomy.  The risks and benefits of the procedure including bleeding, infection, and the possibility of recurrence of the fistula were fully explained to the patient, who gave informed consent.  Procedure note: The patient was placed in the lithotomy position after general anesthesia was administered.  The perineum was prepped and draped using usual sterile technique with Betadine.  Surgical site confirmation was performed.  The patient had a fistulous track which was open to the left at the base of the scrotum.  This extended subcutaneously down towards the anus, but did not involve the anus or the rectum.  No musculature was involved as the fistulous tract was subcutaneous in nature.  The fistulous tract was unroofed.  Any granulomatous tissue was debrided using a curette.  A bleeding was controlled using Bovie electrocautery.  The subcutaneous layer was reapproximated using 3-0 Vicryl interrupted sutures.  Exparel was instilled into the surrounding wound.  The skin was closed using a 4-0 Monocryl subcuticular suture.  Dermabond was applied.  All tape and needle counts were correct at the end of the procedure.  The patient was awakened and transferred to PACU in stable condition.  Complications: None  EBL: Minimal  Specimen: None

## 2020-02-04 NOTE — Anesthesia Postprocedure Evaluation (Signed)
Anesthesia Post Note  Patient: Barry Ross  Procedure(s) Performed: FISTULECTOMY (N/A Rectum)  Patient location during evaluation: PACU Anesthesia Type: General Level of consciousness: awake and alert and oriented Pain management: pain level controlled Vital Signs Assessment: post-procedure vital signs reviewed and stable Respiratory status: spontaneous breathing Cardiovascular status: blood pressure returned to baseline and stable Postop Assessment: no apparent nausea or vomiting Anesthetic complications: no     Last Vitals:  Vitals:   02/04/20 1000 02/04/20 1011  BP: 115/73 117/67  Pulse: 74 74  Resp: 15 18  Temp:  36.7 C  SpO2: 97% 96%    Last Pain:  Vitals:   02/04/20 1011  TempSrc: Oral  PainSc: 1                  Rin Gorton

## 2020-02-04 NOTE — Interval H&P Note (Signed)
History and Physical Interval Note:  02/04/2020 8:15 AM  Barry Ross  has presented today for surgery, with the diagnosis of Anal Fistula.  The various methods of treatment have been discussed with the patient and family. After consideration of risks, benefits and other options for treatment, the patient has consented to  Procedure(s): FISTULOTOMY (N/A) as a surgical intervention.  The patient's history has been reviewed, patient examined, no change in status, stable for surgery.  I have reviewed the patient's chart and labs.  Questions were answered to the patient's satisfaction.     Aviva Signs

## 2020-02-04 NOTE — Progress Notes (Signed)
Patient complaint on a tightness upon deep breathing exercise prior to discharge. Denies chest pain, shortness of breath, denies jaw pain, denies nausea, skin warm and dry and pink.  Oxygen Saturation at 97%.  "I feel fine , just a tightness when I took a deep breath". On ausculation of Lungs all quadrants, no wheezing noted. Dr. Briant Cedar Notified via telephone.

## 2020-02-08 ENCOUNTER — Telehealth: Payer: Self-pay | Admitting: General Surgery

## 2020-02-10 ENCOUNTER — Encounter: Payer: Self-pay | Admitting: General Surgery

## 2020-02-10 ENCOUNTER — Other Ambulatory Visit: Payer: Self-pay

## 2020-02-10 ENCOUNTER — Ambulatory Visit (INDEPENDENT_AMBULATORY_CARE_PROVIDER_SITE_OTHER): Payer: Self-pay | Admitting: General Surgery

## 2020-02-10 ENCOUNTER — Ambulatory Visit: Payer: Self-pay | Admitting: General Surgery

## 2020-02-10 VITALS — BP 125/72 | HR 62 | Temp 98.1°F | Resp 12 | Ht 69.5 in | Wt 214.0 lb

## 2020-02-10 DIAGNOSIS — Z09 Encounter for follow-up examination after completed treatment for conditions other than malignant neoplasm: Secondary | ICD-10-CM

## 2020-02-10 NOTE — Progress Notes (Signed)
Subjective:     Barry Ross  Here for postoperative wound check.  Patient has had some yellowish drainage from the incision. Objective:    BP 125/72   Pulse 62   Temp 98.1 F (36.7 C) (Oral)   Resp 12   Ht 5' 9.5" (1.765 m)   Wt 214 lb (97.1 kg)   SpO2 97%   BMI 31.15 kg/m   General:  alert, cooperative and no distress  Perineal wound has opened up superficially.  No purulent drainage is present.     Assessment:    Superficial wound dehiscence, healing well by secondary intention.  This was not unexpected.    Plan:   Keep wound clean and dry.  May use soap and water.  This may delay his healing from the surgery.  Patient understands.  Follow-up here in 2 weeks for wound check.

## 2020-02-10 NOTE — Patient Instructions (Signed)
You have no restrictions.  Keep area clean daily with soap and water.

## 2020-02-24 ENCOUNTER — Encounter: Payer: Self-pay | Admitting: General Surgery

## 2020-02-24 ENCOUNTER — Ambulatory Visit (INDEPENDENT_AMBULATORY_CARE_PROVIDER_SITE_OTHER): Payer: Self-pay | Admitting: General Surgery

## 2020-02-24 ENCOUNTER — Other Ambulatory Visit: Payer: Self-pay

## 2020-02-24 VITALS — BP 134/88 | HR 52 | Temp 98.4°F | Resp 14 | Ht 69.5 in | Wt 216.0 lb

## 2020-02-24 DIAGNOSIS — Z09 Encounter for follow-up examination after completed treatment for conditions other than malignant neoplasm: Secondary | ICD-10-CM

## 2020-02-25 NOTE — Progress Notes (Signed)
Subjective:     Barry Ross  Here for postoperative visit.  Patient states he has minimal drainage from the incision.  He states it feels good and he is pleased with the results. Objective:    BP 134/88   Pulse (!) 52   Temp 98.4 F (36.9 C) (Oral)   Resp 14   Ht 5' 9.5" (1.765 m)   Wt 216 lb (98 kg)   SpO2 97%   BMI 31.44 kg/m   General:  alert, cooperative and no distress  Perineal wound is healing well by secondary intention.  Granulation tissue at the base is present and silver nitrate was applied.  No induration or purulent drainage present.     Assessment:    Doing well postoperatively.    Plan:   Continue current wound care.  Follow-up here in 2 weeks.

## 2020-03-09 ENCOUNTER — Other Ambulatory Visit: Payer: Self-pay

## 2020-03-09 ENCOUNTER — Ambulatory Visit (INDEPENDENT_AMBULATORY_CARE_PROVIDER_SITE_OTHER): Payer: Self-pay | Admitting: General Surgery

## 2020-03-09 ENCOUNTER — Encounter: Payer: Self-pay | Admitting: General Surgery

## 2020-03-09 VITALS — BP 129/78 | HR 54 | Temp 98.5°F | Resp 12 | Ht 69.5 in | Wt 212.0 lb

## 2020-03-09 DIAGNOSIS — Z09 Encounter for follow-up examination after completed treatment for conditions other than malignant neoplasm: Secondary | ICD-10-CM

## 2020-03-09 NOTE — Progress Notes (Signed)
Subjective:     Barry Ross  Here for postoperative visit.  He states he is doing very well.  Has fully returned to work.  Has no drainage.  Is pleased with the surgery results. Objective:    BP 129/78   Pulse (!) 54   Temp 98.5 F (36.9 C) (Oral)   Resp 12   Ht 5' 9.5" (1.765 m)   Wt 212 lb (96.2 kg)   SpO2 97%   BMI 30.86 kg/m   General:  alert, cooperative and no distress  Perineal wound almost closed.  No drainage or erythema.     Assessment:    Wound has almost closed by secondary intention    Plan:    Patient will follow up with me as needed.

## 2022-08-30 ENCOUNTER — Encounter: Payer: Self-pay | Admitting: Internal Medicine

## 2024-11-17 ENCOUNTER — Emergency Department (HOSPITAL_BASED_OUTPATIENT_CLINIC_OR_DEPARTMENT_OTHER): Admitting: Radiology

## 2024-11-17 ENCOUNTER — Encounter (HOSPITAL_BASED_OUTPATIENT_CLINIC_OR_DEPARTMENT_OTHER): Payer: Self-pay

## 2024-11-17 ENCOUNTER — Other Ambulatory Visit: Payer: Self-pay

## 2024-11-17 DIAGNOSIS — Z7982 Long term (current) use of aspirin: Secondary | ICD-10-CM | POA: Insufficient documentation

## 2024-11-17 DIAGNOSIS — M546 Pain in thoracic spine: Secondary | ICD-10-CM | POA: Insufficient documentation

## 2024-11-17 NOTE — ED Triage Notes (Signed)
 Pt c/o bottom of the shoulder blade/ ribs muscle pull x2 days, worsening exponentially in the last 6hrs. States pain started on L side, now it's just a band under the ribs.  Last ibuprofen approx 8p; states he felt relief this AM from same, but it's not touched it since.

## 2024-11-18 ENCOUNTER — Emergency Department (HOSPITAL_BASED_OUTPATIENT_CLINIC_OR_DEPARTMENT_OTHER)
Admission: EM | Admit: 2024-11-18 | Discharge: 2024-11-18 | Disposition: A | Discharge status: Home / Self Care | Attending: Emergency Medicine | Admitting: Emergency Medicine

## 2024-11-18 ENCOUNTER — Other Ambulatory Visit: Payer: Self-pay

## 2024-11-18 DIAGNOSIS — M546 Pain in thoracic spine: Secondary | ICD-10-CM

## 2024-11-18 LAB — CBC WITH DIFFERENTIAL/PLATELET
Abs Immature Granulocytes: 0.08 K/uL — ABNORMAL HIGH (ref 0.00–0.07)
Basophils Absolute: 0.1 K/uL (ref 0.0–0.1)
Basophils Relative: 0 %
Eosinophils Absolute: 0.2 K/uL (ref 0.0–0.5)
Eosinophils Relative: 1 %
HCT: 38.6 % — ABNORMAL LOW (ref 39.0–52.0)
Hemoglobin: 13.7 g/dL (ref 13.0–17.0)
Immature Granulocytes: 1 %
Lymphocytes Relative: 22 %
Lymphs Abs: 3.4 K/uL (ref 0.7–4.0)
MCH: 32.1 pg (ref 26.0–34.0)
MCHC: 35.5 g/dL (ref 30.0–36.0)
MCV: 90.4 fL (ref 80.0–100.0)
Monocytes Absolute: 2.5 K/uL — ABNORMAL HIGH (ref 0.1–1.0)
Monocytes Relative: 16 %
Neutro Abs: 9.3 K/uL — ABNORMAL HIGH (ref 1.7–7.7)
Neutrophils Relative %: 60 %
Platelets: 470 K/uL — ABNORMAL HIGH (ref 150–400)
RBC: 4.27 MIL/uL (ref 4.22–5.81)
RDW: 13.3 % (ref 11.5–15.5)
WBC: 15.5 K/uL — ABNORMAL HIGH (ref 4.0–10.5)
nRBC: 0 % (ref 0.0–0.2)

## 2024-11-18 LAB — TROPONIN T, HIGH SENSITIVITY: Troponin T High Sensitivity: <15 ng/L (ref 0–19)

## 2024-11-18 LAB — COMPREHENSIVE METABOLIC PANEL WITH GFR
ALT: 23 U/L (ref 0–44)
AST: 24 U/L (ref 15–41)
Albumin: 4.5 g/dL (ref 3.5–5.0)
Alkaline Phosphatase: 82 U/L (ref 38–126)
Anion gap: 14 (ref 5–15)
BUN: 14 mg/dL (ref 6–20)
CO2: 23 mmol/L (ref 22–32)
Calcium: 9.8 mg/dL (ref 8.9–10.3)
Chloride: 100 mmol/L (ref 98–111)
Creatinine, Ser: 1.02 mg/dL (ref 0.61–1.24)
GFR, Estimated: >60 mL/min
Glucose, Bld: 98 mg/dL (ref 70–99)
Potassium: 4 mmol/L (ref 3.5–5.1)
Sodium: 137 mmol/L (ref 135–145)
Total Bilirubin: 0.6 mg/dL (ref 0.0–1.2)
Total Protein: 7.6 g/dL (ref 6.5–8.1)

## 2024-11-18 MED ORDER — DIAZEPAM 5 MG/ML IJ SOLN
5.0000 mg | Freq: Once | INTRAMUSCULAR | Status: AC
  Administered 2024-11-18: 2.5 mg via INTRAVENOUS
  Filled 2024-11-18: qty 2

## 2024-11-18 MED ORDER — DIAZEPAM 5 MG PO TABS: 2.5000 mg | ORAL_TABLET | Freq: Two times a day (BID) | ORAL | 0 refills | Status: AC

## 2024-11-18 MED ORDER — IBUPROFEN 400 MG PO TABS: 400.0000 mg | ORAL_TABLET | Freq: Three times a day (TID) | ORAL | 0 refills | Status: AC

## 2024-11-18 MED ORDER — KETOROLAC TROMETHAMINE 30 MG/ML IJ SOLN
15.0000 mg | Freq: Once | INTRAMUSCULAR | Status: AC
  Administered 2024-11-18: 15 mg via INTRAVENOUS
  Filled 2024-11-18: qty 1

## 2024-11-18 MED ORDER — MORPHINE SULFATE 30 MG PO TABS: 15.0000 mg | ORAL_TABLET | ORAL | 0 refills | Status: DC | PRN

## 2024-11-18 NOTE — ED Provider Notes (Signed)
 " [place] EMERGENCY DEPARTMENT AT [facility] Provider Note   CSN: [id number] Arrival date & time: 11/17/24  2318     Patient presents with: No chief complaint on file.   Barry Ross is a 59 y.o. male.   59 year old male who presents ER today with back pain.  Patient states that it started on the left and then moved over to the right.  Worse with certain movements but saying very very still just feels tight.  He states he was sick with some type of GI illness on Monday got dehydrated.  Went back to working out on Tuesday but just use bands and very very light workout.  Woke up Wednesday morning with this pain.  Ibuprofen  helped initially but then after it wore off it progressively got worse and no longer helped.  States that he feels as though he has a tight band around his upper abdomen.  No nausea vomiting, incontinence, constipation, fevers, other recent illnesses.  No lower extremity weakness.  No rash.  No trauma.        Prior to Admission medications  Medication Sig Start Date End Date Taking? Authorizing Provider  diazepam  (VALIUM ) 5 MG tablet Take 0.5-1 tablets (2.5-5 mg total) by mouth 2 (two) times daily. 11/18/24  Yes Emile Kyllo, Selinda, MD  ibuprofen  (ADVIL ) 400 MG tablet Take 1 tablet (400 mg total) by mouth 3 (three) times daily. 11/18/24  Yes Mayrani Khamis, Selinda, MD  morphine  (MSIR) 30 MG tablet Take 0.5 tablets (15 mg total) by mouth every 4 (four) hours as needed for severe pain (pain score 7-10). 11/18/24  Yes Taylor Spilde, Selinda, MD  amLODipine  (NORVASC ) 5 MG tablet Take 5 mg by mouth daily.    [provider]  Ascorbic Acid (VITAMIN C WITH ROSE HIPS) 500 MG tablet Take 500 mg by mouth daily.    [provider]  aspirin  EC 81 MG tablet Take 81 mg by mouth daily.    [provider]  atorvastatin  (LIPITOR) 40 MG tablet Take 40 mg by mouth daily.     [provider]  budesonide-formoterol (SYMBICORT) 160-4.5 MCG/ACT inhaler Inhale 1-2 puffs into  the lungs daily as needed (shortness of breath).     [provider]  cholecalciferol (VITAMIN D) 25 MCG (1000 UNIT) tablet Take 1,000 Units by mouth daily.    [provider]  lisinopril  (ZESTRIL ) 20 MG tablet Take 20 mg by mouth daily. 11/22/19   [provider]  naphazoline-pheniramine (ALLERGY EYE) 0.025-0.3 % ophthalmic solution Place 1 drop into both eyes 3 (three) times daily as needed for eye irritation.    [provider]  naproxen (NAPROSYN) 500 MG tablet Take 500 mg by mouth 2 (two) times daily as needed for moderate pain (pain.).     [provider]  Omega-3 Fatty Acids (FISH OIL) 1200 MG CAPS Take 1,200 mg by mouth daily.    [provider]  pantoprazole  (PROTONIX ) 40 MG tablet Take 1 tablet (40 mg total) by mouth daily. 06/07/11 01/25/21  Golda Claudis PENNER, MD  sildenafil (REVATIO) 20 MG tablet Take 20-100 mg by mouth daily as needed for erectile dysfunction. 08/27/19   [provider]  triamcinolone cream (KENALOG) 0.1 % Apply 1 application topically 2 (two) times daily as needed (itching).    [provider]    Allergies: Other    Review of Systems  Updated Vital Signs BP 137/82   Pulse 65   Temp 98.6 F (37 C)   Resp  20   SpO2 99%   Physical Exam Vitals and nursing note reviewed.  Constitutional:      Appearance: He is well-developed.  HENT:     Head: Normocephalic and atraumatic.     Nose: No congestion or rhinorrhea.  Cardiovascular:     Rate and Rhythm: Normal rate.  Pulmonary:     Effort: Pulmonary effort is normal. No respiratory distress.  Abdominal:     General: There is no distension.  Musculoskeletal:        General: No tenderness. Normal range of motion.     Cervical back: Normal range of motion.  Skin:    General: Skin is warm and dry.  Neurological:     Mental Status: He is alert.     (all labs ordered are listed, but only abnormal results are displayed) Labs Reviewed  CBC  WITH DIFFERENTIAL/PLATELET - Abnormal; Notable for the following components:      Result Value   WBC 15.5 (*)    HCT 38.6 (*)    Platelets 470 (*)    Neutro Abs 9.3 (*)    Monocytes Absolute 2.5 (*)    Abs Immature Granulocytes 0.08 (*)    All other components within normal limits  COMPREHENSIVE METABOLIC PANEL WITH GFR  TROPONIN T, HIGH SENSITIVITY    EKG: None  Radiology: DG Chest 2 View Result Date: 11/17/2024 EXAM: 2 VIEW(S) XRAY OF THE CHEST 11/17/2024 11:45:00 PM COMPARISON: None available. CLINICAL HISTORY: back pain in ribs FINDINGS: LUNGS AND PLEURA: Subsegmental atelectasis in lung bases. No pleural effusion. No pneumothorax. HEART AND MEDIASTINUM: No acute abnormality of the cardiac and mediastinal silhouettes. BONES AND SOFT TISSUES: No acute osseous abnormality. IMPRESSION: 1. No acute findings. Electronically signed by: Greig Pique MD MD 11/17/2024 11:47 PM EST RP Workstation: [id number]     Procedures   Medications Ordered in the ED  ketorolac  (TORADOL ) 30 MG/ML injection 15 mg (15 mg Intravenous Given 11/18/24 0436)  diazepam  (VALIUM ) injection 5 mg (2.5 mg Intravenous Given 11/18/24 0439)                                    Medical Decision Making Amount and/or Complexity of Data Reviewed Radiology: ordered.  Risk Prescription drug management.   Mild leukocytosis likely related to his recent GI illness. X-ray did not show any evidence of spinal pathology however it was a chest x-ray.  Had episode like this a few months back that self resolved.  That was related to a chiropractor's appointment.  No symptoms to suggest emergent need for MRI.  I discussed doing a CT scan of his thoracic spine however he defers at this time as we both understood that MRI is more helpful to rule out spinal pathology, disc pathology, nerve pathology.  He prefers to follow-up with his doctor for those tests.  Will use symptomatic treatment in the meantime to include muscle relaxer,  anti-inflammatories, stretching, massage.  Return here for any new or worsening symptoms.   Final diagnoses:  Acute bilateral thoracic back pain    ED Discharge Orders          Ordered    diazepam  (VALIUM ) 5 MG tablet  2 times daily        11/18/24 0437    ibuprofen  (ADVIL ) 400 MG tablet  3 times daily        11/18/24 0437    morphine  (MSIR) 30 MG tablet  Every 4 hours PRN        11/18/24 0437               Johnwesley Lederman, Selinda, MD 11/18/24 [phone]  "

## 2024-11-19 ENCOUNTER — Emergency Department (HOSPITAL_COMMUNITY)

## 2024-11-19 ENCOUNTER — Encounter (HOSPITAL_COMMUNITY): Payer: Self-pay

## 2024-11-19 ENCOUNTER — Inpatient Hospital Stay (HOSPITAL_COMMUNITY)
Admission: EM | Admit: 2024-11-19 | Discharge: 2024-11-22 | DRG: 872 | Disposition: A | Discharge status: Home / Self Care | Source: Ambulatory Visit | Attending: Hospitalist | Admitting: Hospitalist

## 2024-11-19 ENCOUNTER — Other Ambulatory Visit: Payer: Self-pay

## 2024-11-19 DIAGNOSIS — E860 Dehydration: Secondary | ICD-10-CM | POA: Diagnosis present

## 2024-11-19 DIAGNOSIS — Z79899 Other long term (current) drug therapy: Secondary | ICD-10-CM | POA: Diagnosis not present

## 2024-11-19 DIAGNOSIS — E669 Obesity, unspecified: Secondary | ICD-10-CM | POA: Diagnosis present

## 2024-11-19 DIAGNOSIS — Z7982 Long term (current) use of aspirin: Secondary | ICD-10-CM | POA: Diagnosis not present

## 2024-11-19 DIAGNOSIS — Z1152 Encounter for screening for COVID-19: Secondary | ICD-10-CM | POA: Diagnosis not present

## 2024-11-19 DIAGNOSIS — M4802 Spinal stenosis, cervical region: Secondary | ICD-10-CM | POA: Diagnosis not present

## 2024-11-19 DIAGNOSIS — K219 Gastro-esophageal reflux disease without esophagitis: Secondary | ICD-10-CM | POA: Diagnosis present

## 2024-11-19 DIAGNOSIS — I10 Essential (primary) hypertension: Secondary | ICD-10-CM | POA: Diagnosis present

## 2024-11-19 DIAGNOSIS — Z7951 Long term (current) use of inhaled steroids: Secondary | ICD-10-CM | POA: Diagnosis not present

## 2024-11-19 DIAGNOSIS — Z9081 Acquired absence of spleen: Secondary | ICD-10-CM

## 2024-11-19 DIAGNOSIS — M50323 Other cervical disc degeneration at C6-C7 level: Secondary | ICD-10-CM | POA: Diagnosis present

## 2024-11-19 DIAGNOSIS — Z6830 Body mass index (BMI) 30.0-30.9, adult: Secondary | ICD-10-CM | POA: Diagnosis not present

## 2024-11-19 DIAGNOSIS — M5126 Other intervertebral disc displacement, lumbar region: Secondary | ICD-10-CM | POA: Diagnosis not present

## 2024-11-19 DIAGNOSIS — E785 Hyperlipidemia, unspecified: Secondary | ICD-10-CM | POA: Diagnosis present

## 2024-11-19 DIAGNOSIS — Z833 Family history of diabetes mellitus: Secondary | ICD-10-CM

## 2024-11-19 DIAGNOSIS — R509 Fever, unspecified: Secondary | ICD-10-CM | POA: Diagnosis present

## 2024-11-19 DIAGNOSIS — A419 Sepsis, unspecified organism: Secondary | ICD-10-CM | POA: Diagnosis present

## 2024-11-19 DIAGNOSIS — Z720 Tobacco use: Secondary | ICD-10-CM | POA: Diagnosis not present

## 2024-11-19 DIAGNOSIS — M542 Cervicalgia: Secondary | ICD-10-CM | POA: Diagnosis not present

## 2024-11-19 DIAGNOSIS — M47812 Spondylosis without myelopathy or radiculopathy, cervical region: Secondary | ICD-10-CM

## 2024-11-19 LAB — MENINGITIS/ENCEPHALITIS PANEL (CSF)

## 2024-11-19 LAB — CBC WITH DIFFERENTIAL/PLATELET
Basophils Absolute: 0 K/uL (ref 0.0–0.1)
Basophils Relative: 0 %
Eosinophils Absolute: 0.2 K/uL (ref 0.0–0.5)
Eosinophils Relative: 1 %
HCT: 39.9 % (ref 39.0–52.0)
Hemoglobin: 13.7 g/dL (ref 13.0–17.0)
Lymphocytes Relative: 20 %
Lymphs Abs: 3.4 K/uL (ref 0.7–4.0)
MCH: 31.6 pg (ref 26.0–34.0)
MCHC: 34.3 g/dL (ref 30.0–36.0)
MCV: 92.1 fL (ref 80.0–100.0)
Monocytes Absolute: 2.4 K/uL — ABNORMAL HIGH (ref 0.1–1.0)
Monocytes Relative: 14 %
Neutro Abs: 11.1 K/uL — ABNORMAL HIGH (ref 1.7–7.7)
Neutrophils Relative %: 65 %
Platelets: 484 K/uL — ABNORMAL HIGH (ref 150–400)
RBC: 4.33 MIL/uL (ref 4.22–5.81)
RDW: 13.4 % (ref 11.5–15.5)
Smear Review: NORMAL
WBC: 17.1 K/uL — ABNORMAL HIGH (ref 4.0–10.5)
nRBC: 0 % (ref 0.0–0.2)

## 2024-11-19 LAB — HEPATIC FUNCTION PANEL
ALT: 20 U/L (ref 0–44)
AST: 22 U/L (ref 15–41)
Albumin: 4.4 g/dL (ref 3.5–5.0)
Alkaline Phosphatase: 74 U/L (ref 38–126)
Bilirubin, Direct: 0.4 mg/dL — ABNORMAL HIGH (ref 0.0–0.2)
Indirect Bilirubin: 0.5 mg/dL (ref 0.3–0.9)
Total Bilirubin: 0.9 mg/dL (ref 0.0–1.2)
Total Protein: 7.6 g/dL (ref 6.5–8.1)

## 2024-11-19 LAB — RESP PANEL BY RT-PCR (RSV, FLU A&B, COVID)  RVPGX2
Influenza A by PCR: NEGATIVE
Influenza B by PCR: NEGATIVE
Resp Syncytial Virus by PCR: NEGATIVE
SARS Coronavirus 2 by RT PCR: NEGATIVE

## 2024-11-19 LAB — CSF CELL COUNT WITH DIFFERENTIAL
RBC Count, CSF: 8 /mm3 — ABNORMAL HIGH
RBC Count, CSF: 1 /mm3 — ABNORMAL HIGH
Tube #: 1
Tube #: 4
WBC, CSF: 2 /mm3 (ref 0–5)
WBC, CSF: 2 /mm3 (ref 0–5)

## 2024-11-19 LAB — PROTEIN AND GLUCOSE, CSF
Glucose, CSF: 61 mg/dL (ref 40–70)
Total  Protein, CSF: 50 mg/dL — ABNORMAL HIGH (ref 15–45)

## 2024-11-19 LAB — BASIC METABOLIC PANEL WITH GFR
Anion gap: 15 (ref 5–15)
BUN: 8 mg/dL (ref 6–20)
CO2: 23 mmol/L (ref 22–32)
Calcium: 9.6 mg/dL (ref 8.9–10.3)
Chloride: 101 mmol/L (ref 98–111)
Creatinine, Ser: 0.89 mg/dL (ref 0.61–1.24)
GFR, Estimated: >60 mL/min
Glucose, Bld: 97 mg/dL (ref 70–99)
Potassium: 3.7 mmol/L (ref 3.5–5.1)
Sodium: 139 mmol/L (ref 135–145)

## 2024-11-19 LAB — LACTIC ACID, PLASMA: Lactic Acid, Venous: 1 mmol/L (ref 0.5–1.9)

## 2024-11-19 MED ORDER — VANCOMYCIN HCL IN DEXTROSE 1-5 GM/200ML-% IV SOLN: 1000.0000 mg | Freq: Once | INTRAVENOUS | Status: DC

## 2024-11-19 MED ORDER — KETOROLAC TROMETHAMINE 30 MG/ML IJ SOLN
30.0000 mg | Freq: Four times a day (QID) | INTRAMUSCULAR | Status: DC | PRN
  Filled 2024-11-19: qty 1

## 2024-11-19 MED ORDER — HYDROMORPHONE HCL 1 MG/ML IJ SOLN
1.0000 mg | Freq: Once | INTRAMUSCULAR | Status: AC
  Administered 2024-11-19: 1 mg via INTRAVENOUS
  Filled 2024-11-19: qty 1

## 2024-11-19 MED ORDER — VANCOMYCIN HCL 1250 MG/250ML IV SOLN: 1250.0000 mg | Freq: Two times a day (BID) | INTRAVENOUS | Status: DC

## 2024-11-19 MED ORDER — ATORVASTATIN CALCIUM 40 MG PO TABS
40.0000 mg | ORAL_TABLET | Freq: Every day | ORAL | Status: DC
  Administered 2024-11-20 – 2024-11-22 (×3): 40 mg via ORAL
  Filled 2024-11-19 (×3): qty 1

## 2024-11-19 MED ORDER — HYDROMORPHONE HCL 1 MG/ML IJ SOLN
1.0000 mg | INTRAMUSCULAR | Status: DC | PRN
  Administered 2024-11-19 – 2024-11-22 (×16): 1 mg via INTRAVENOUS
  Filled 2024-11-19 (×16): qty 1

## 2024-11-19 MED ORDER — BISACODYL 10 MG RE SUPP
10.0000 mg | Freq: Every day | RECTAL | Status: DC | PRN
  Administered 2024-11-21 – 2024-11-22 (×2): 10 mg via RECTAL
  Filled 2024-11-19 (×2): qty 1

## 2024-11-19 MED ORDER — ONDANSETRON HCL 4 MG PO TABS: 4.0000 mg | ORAL_TABLET | Freq: Four times a day (QID) | ORAL | Status: DC | PRN

## 2024-11-19 MED ORDER — FLUTICASONE FUROATE-VILANTEROL 200-25 MCG/ACT IN AEPB
1.0000 | INHALATION_SPRAY | Freq: Every day | RESPIRATORY_TRACT | Status: DC
  Filled 2024-11-19: qty 28

## 2024-11-19 MED ORDER — LIDOCAINE HCL (PF) 1 % IJ SOLN
2.0000 mL | INTRAMUSCULAR | Status: DC
  Administered 2024-11-19: 2 mL

## 2024-11-19 MED ORDER — ONDANSETRON HCL 4 MG/2ML IJ SOLN: 4.0000 mg | Freq: Four times a day (QID) | INTRAMUSCULAR | Status: DC | PRN

## 2024-11-19 MED ORDER — LISINOPRIL 10 MG PO TABS
20.0000 mg | ORAL_TABLET | Freq: Every day | ORAL | Status: DC
  Administered 2024-11-20 – 2024-11-22 (×3): 20 mg via ORAL
  Filled 2024-11-19 (×3): qty 2

## 2024-11-19 MED ORDER — PANTOPRAZOLE SODIUM 40 MG PO TBEC
40.0000 mg | DELAYED_RELEASE_TABLET | Freq: Every day | ORAL | Status: DC
  Administered 2024-11-19 – 2024-11-22 (×4): 40 mg via ORAL
  Filled 2024-11-19 (×4): qty 1

## 2024-11-19 MED ORDER — LACTATED RINGERS IV SOLN
150.0000 mL/h | INTRAVENOUS | Status: AC
  Administered 2024-11-19 – 2024-11-20 (×3): 150 mL/h via INTRAVENOUS

## 2024-11-19 MED ORDER — ASPIRIN 81 MG PO TBEC
81.0000 mg | DELAYED_RELEASE_TABLET | Freq: Every day | ORAL | Status: DC
  Administered 2024-11-20 – 2024-11-22 (×2): 81 mg via ORAL
  Filled 2024-11-19 (×2): qty 1

## 2024-11-19 MED ORDER — GADOBUTROL 1 MMOL/ML IV SOLN
9.0000 mL | Freq: Once | INTRAVENOUS | Status: AC | PRN
  Administered 2024-11-19: 9 mL via INTRAVENOUS

## 2024-11-19 MED ORDER — VANCOMYCIN HCL 2000 MG/400ML IV SOLN
2000.0000 mg | Freq: Once | INTRAVENOUS | Status: DC
  Filled 2024-11-19: qty 400

## 2024-11-19 MED ORDER — VANCOMYCIN HCL 2000 MG/400ML IV SOLN
2000.0000 mg | Freq: Once | INTRAVENOUS | Status: AC
  Administered 2024-11-19: 2000 mg via INTRAVENOUS
  Filled 2024-11-19 (×2): qty 400

## 2024-11-19 MED ORDER — SODIUM CHLORIDE 0.9 % IV SOLN
2.0000 g | Freq: Once | INTRAVENOUS | Status: AC
  Administered 2024-11-19: 2 g via INTRAVENOUS
  Filled 2024-11-19: qty 20

## 2024-11-19 MED ORDER — METRONIDAZOLE 500 MG/100ML IV SOLN
500.0000 mg | Freq: Two times a day (BID) | INTRAVENOUS | Status: DC
  Administered 2024-11-19 – 2024-11-22 (×6): 500 mg via INTRAVENOUS
  Filled 2024-11-19 (×6): qty 100

## 2024-11-19 MED ORDER — SODIUM CHLORIDE 0.9 % IV SOLN
2.0000 g | INTRAVENOUS | Status: DC
  Administered 2024-11-20 – 2024-11-21 (×2): 2 g via INTRAVENOUS
  Filled 2024-11-19 (×2): qty 20

## 2024-11-19 MED ORDER — ACETAMINOPHEN 325 MG PO TABS
650.0000 mg | ORAL_TABLET | Freq: Once | ORAL | Status: AC
  Administered 2024-11-19: 650 mg via ORAL
  Filled 2024-11-19: qty 2

## 2024-11-19 MED ORDER — AMLODIPINE BESYLATE 5 MG PO TABS
5.0000 mg | ORAL_TABLET | Freq: Every day | ORAL | Status: DC
  Administered 2024-11-20 – 2024-11-22 (×3): 5 mg via ORAL
  Filled 2024-11-19 (×3): qty 1

## 2024-11-19 MED ORDER — ACETAMINOPHEN 500 MG PO TABS
1000.0000 mg | ORAL_TABLET | Freq: Four times a day (QID) | ORAL | Status: DC | PRN
  Administered 2024-11-19 – 2024-11-22 (×6): 1000 mg via ORAL
  Filled 2024-11-19 (×6): qty 2

## 2024-11-19 MED ORDER — ENOXAPARIN SODIUM 40 MG/0.4ML IJ SOSY
40.0000 mg | PREFILLED_SYRINGE | INTRAMUSCULAR | Status: DC
  Administered 2024-11-20 – 2024-11-22 (×3): 40 mg via SUBCUTANEOUS
  Filled 2024-11-19 (×3): qty 0.4

## 2024-11-19 MED ORDER — VANCOMYCIN HCL 1250 MG/250ML IV SOLN
1250.0000 mg | Freq: Two times a day (BID) | INTRAVENOUS | Status: DC
  Administered 2024-11-20 – 2024-11-21 (×4): 1250 mg via INTRAVENOUS
  Filled 2024-11-19 (×4): qty 250

## 2024-11-19 NOTE — ED Provider Notes (Signed)
 " [place] EMERGENCY DEPARTMENT AT [facility] Provider Note   CSN: [id number] Arrival date & time: 11/19/24  9263     Patient presents with: Neck Pain   Barry Ross is a 59 y.o. male.    Neck Pain Patient presents with neck and mid back pain.  Has had now for couple weeks.  States 2-1/2 months ago had an episode where back spasmed up.  Started below shoulder blades but eventually involve the whole back.  That lasted 8 to 10 hours but has had pain since along with episodes of some spasming.  Had flu around Christmas time and earlier this week had GI type illness.  Now worsening pain.  Reportedly no fevers up until this morning.  Pain in the back is worse.  Cannot move his head.  Able to move legs.  States he has slept in the recliner the last 3 days because he cannot lay flat well.  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  Went to see PCP today.  Came in to rule out a reported viral meningitis.  Recently seen in the ER on Wednesday.  Given morphine  and Valium  for home.  Really has not helped the pain.    Past Medical History:  Diagnosis Date   Allergic rhinitis    Allergy    Asthma    Clotting disorder    Depression    Patient denies   GERD (gastroesophageal reflux disease)    Hyperlipidemia    Past Surgical History:  Procedure Laterality Date   anal cyst removal  2015   ANAL FISTULOTOMY N/A 09/17/2019   Procedure: ANAL FISTULOTOMY;  Surgeon: Mavis Anes, MD;  Location: [facility];  Service: General;  Laterality: N/A;   BALLOON DILATION  06/07/2011   Procedure: MERRILL HODGKIN;  Surgeon: Claudis RAYMOND Rivet, MD;  Location: [facility];  Service: Endoscopy;  Laterality: N/A;   ESOPHAGOGASTRODUODENOSCOPY  06/07/2011   Procedure: ESOPHAGOGASTRODUODENOSCOPY (EGD);  Surgeon: Claudis RAYMOND Rivet, MD;  Location: [facility];  Service: Endoscopy;  Laterality: N/A;   FISSURECTOMY N/A 02/04/2020   Procedure: FISTULECTOMY;  Surgeon: Mavis Anes, MD;  Location: [facility];  Service: General;   Laterality: N/A;   SPLENECTOMY     Secondary to ITP    Prior to Admission medications  Medication Sig Start Date End Date Taking? Authorizing Provider  amLODipine  (NORVASC ) 5 MG tablet Take 5 mg by mouth daily.    [provider]  Ascorbic Acid (VITAMIN C WITH ROSE HIPS) 500 MG tablet Take 500 mg by mouth daily.    [provider]  aspirin  EC 81 MG tablet Take 81 mg by mouth daily.    [provider]  atorvastatin  (LIPITOR) 40 MG tablet Take 40 mg by mouth daily.     [provider]  budesonide-formoterol (SYMBICORT) 160-4.5 MCG/ACT inhaler Inhale 1-2 puffs into the lungs daily as needed (shortness of breath).     [provider]  cholecalciferol (VITAMIN D) 25 MCG (1000 UNIT) tablet Take 1,000 Units by mouth daily.    [provider]  diazepam  (VALIUM ) 5 MG tablet Take 0.5-1 tablets (2.5-5 mg total) by mouth 2 (two) times daily. 11/18/24   Mesner, Selinda, MD  ibuprofen  (ADVIL ) 400 MG tablet Take 1 tablet (400 mg total) by mouth 3 (three) times daily. 11/18/24   Mesner, Selinda, MD  lisinopril  (ZESTRIL ) 20 MG tablet Take 20 mg by mouth daily. 11/22/19   [provider]  morphine  (MSIR) 30 MG tablet Take 0.5  tablets (15 mg total) by mouth every 4 (four) hours as needed for severe pain (pain score 7-10). 11/18/24   Mesner, Jason, MD  naphazoline-pheniramine (ALLERGY EYE) 0.025-0.3 % ophthalmic solution Place 1 drop into both eyes 3 (three) times daily as needed for eye irritation.    [provider]  naproxen (NAPROSYN) 500 MG tablet Take 500 mg by mouth 2 (two) times daily as needed for moderate pain (pain.).     [provider]  Omega-3 Fatty Acids (FISH OIL) 1200 MG CAPS Take 1,200 mg by mouth daily.    [provider]  pantoprazole  (PROTONIX ) 40 MG tablet Take 1 tablet (40 mg total) by mouth daily. 06/07/11 01/25/21  Golda Claudis PENNER, MD  sildenafil (REVATIO) 20 MG tablet Take 20-100 mg by mouth daily as needed for  erectile dysfunction. 08/27/19   [provider]  triamcinolone cream (KENALOG) 0.1 % Apply 1 application topically 2 (two) times daily as needed (itching).    [provider]    Allergies: Other    Review of Systems  Musculoskeletal:  Positive for neck pain.    Updated Vital Signs BP 124/71   Pulse 88   Temp 99.1 F (37.3 C) (Oral)   Resp 20   Ht 5' 9 (1.753 m)   Wt 96.2 kg   SpO2 92%   BMI 31.32 kg/m   Physical Exam Vitals and nursing note reviewed.  Cardiovascular:     Rate and Rhythm: Regular rhythm.  Chest:     Chest wall: No tenderness.  Abdominal:     Tenderness: There is no abdominal tenderness.  Musculoskeletal:     Cervical back: Rigidity present.     Comments: Good straight leg raise bilaterally.  Skin:    Capillary Refill: Capillary refill takes less than 2 seconds.  Neurological:     Mental Status: He is alert and oriented to person, place, and time.     (all labs ordered are listed, but only abnormal results are displayed) Labs Reviewed  CBC WITH DIFFERENTIAL/PLATELET - Abnormal; Notable for the following components:      Result Value   WBC 17.1 (*)    Platelets 484 (*)    Neutro Abs 11.1 (*)    Monocytes Absolute 2.4 (*)    All other components within normal limits  BASIC METABOLIC PANEL WITH GFR    EKG: None  Radiology: MR THORACIC SPINE W WO CONTRAST Result Date: 11/19/2024 CLINICAL DATA:  Neck and back pain EXAM: MRI THORACIC WITHOUT AND WITH CONTRAST TECHNIQUE: Multiplanar and multiecho pulse sequences of the thoracic spine were obtained without and with intravenous contrast. CONTRAST:  9mL GADAVIST  GADOBUTROL  1 MMOL/ML IV SOLN COMPARISON:  None Available. FINDINGS: Alignment: Normal Bone marrow signal: No significant abnormality Thoracic spinal cord: Normal Facet joints: No significant abnormality Intervertebral discs: There is a central disc herniation at L1-L2 without significant spinal stenosis. No thoracic disc  herniation Paraspinal tissues: No significant abnormality IMPRESSION: 1. No significant abnormality in the thoracic spine 2. Central L1-L2 disc herniation without spinal stenosis Electronically Signed   By: Nancyann Burns M.D.   On: 11/19/2024 09:58   MR Cervical Spine W and Wo Contrast Result Date: 11/19/2024 CLINICAL DATA:  Neck and back pain EXAM: MRI CERVICAL SPINE WITHOUT AND WITH CONTRAST TECHNIQUE: Multiplanar and multiecho pulse sequences of the cervical spine, to include the craniocervical junction and cervicothoracic junction, were obtained without and with intravenous contrast. CONTRAST:  9mL GADAVIST  GADOBUTROL  1 MMOL/ML IV SOLN COMPARISON:  None  Available. FINDINGS: The craniocervical junction is normal. There is no significant bone marrow signal abnormality. The cervical spinal cord is normal. C2-C3: Mild disc bulge, otherwise normal C3-C4: Mild disc bulge. Small foraminal spurs with mild bilateral neural foraminal stenosis. The facet joints are normal C4-C5: There is moderate degenerative disc disease. Bilateral foraminal spurs, larger on the left. There is moderate left neural foraminal stenosis. No significant facet disease C5-C6: There is severe degenerative disc disease with a mild disc bulge. Bilateral foraminal spurs with moderate bilateral neural foraminal stenosis. There is effacement of the thecal sac without compression of the cord, moderate spinal stenosis C6-C7: There is severe degenerative disc disease with a mild disc bulge. There is a right disc/foraminal spur and a small left foraminal spur. Moderate right neural foraminal stenosis. Mild spinal stenosis C7-T1: The disc is normal. Mild left facet arthropathy. No spinal stenosis or foraminal stenosis IMPRESSION: Cervical spondylosis. There is moderate spinal stenosis at C5-6. Multilevel neural foraminal stenosis as outlined above. Electronically Signed   By: Nancyann Burns M.D.   On: 11/19/2024 09:55   DG Chest 2 View Result Date:  11/17/2024 EXAM: 2 VIEW(S) XRAY OF THE CHEST 11/17/2024 11:45:00 PM COMPARISON: None available. CLINICAL HISTORY: back pain in ribs FINDINGS: LUNGS AND PLEURA: Subsegmental atelectasis in lung bases. No pleural effusion. No pneumothorax. HEART AND MEDIASTINUM: No acute abnormality of the cardiac and mediastinal silhouettes. BONES AND SOFT TISSUES: No acute osseous abnormality. IMPRESSION: 1. No acute findings. Electronically signed by: Greig Pique MD MD 11/17/2024 11:47 PM EST RP Workstation: [id number]     Lumbar Puncture  Date/Time: 11/19/2024 2:45 PM  Performed by: Patsey Lot, MD Authorized by: Patsey Lot, MD   Consent:    Consent obtained:  Verbal   Consent given by:  Patient   Risks discussed:  Bleeding, infection, pain, repeat procedure, nerve damage and headache   Alternatives discussed:  No treatment Universal protocol:    Patient identity confirmed:  Verbally with patient Pre-procedure details:    Procedure purpose:  Diagnostic   Preparation: Patient was prepped and draped in usual sterile fashion   Anesthesia:    Anesthesia method:  Local infiltration   Local anesthetic:  2 mL lidocaine  (PF) 1 % Procedure details:    Lumbar space:  L3-L4 interspace   Patient position:  Sitting   Needle gauge:  20   Ultrasound guidance: no     Number of attempts:  2   Fluid appearance:  Clear   Tubes of fluid:  4   Total volume (ml):  3 Post-procedure details:    Puncture site:  Adhesive bandage applied   Procedure completion:  Tolerated well, no immediate complications    Medications Ordered in the ED  HYDROmorphone  (DILAUDID ) injection 1 mg (1 mg Intravenous Given 11/19/24 0835)  gadobutrol  (GADAVIST ) 1 MMOL/ML injection 9 mL (9 mLs Intravenous Contrast Given 11/19/24 0925)                                    Medical Decision Making Amount and/or Complexity of Data Reviewed Labs: ordered. Radiology: ordered.  Risk Prescription drug management.   Patient with  neck pain down to mid back.  Previous history of splenectomy.  Has had worsening pain now over the last couple weeks but worse the last few days.  Now also reportedly had a fever.  Differential diagnosis does include musculoskeletal pain but also other causes such as epidural  abscess and meningitis considered.  Reviewed note and blood work from earlier this week.  Will get MRI of the cervical and thoracic spine.  White count has mildly increased.  From 15-17 although on clear of clinical significance at this time.  MRI of the cervical and thoracic spine overall reassuring.  Does have some bulging disc at C6 and L1.  However clinically this do not necessarily give the mid back pain.  Discussed with patient we will get fluoroscopy guided LP.  Patient was reportedly unable to lay on side for fluoroscopy.  Per IR staff patient thinks he would be able to sit up for a lumbar puncture.   Lumbar puncture done.  Clear fluid.  Awaiting lab results.  Care turned over to Dr. Zammit.      Final diagnoses:  None    ED Discharge Orders     None          Patsey Lot, MD 11/19/24 1447  "

## 2024-11-19 NOTE — H&P (Signed)
 " History and Physical    Patient: Barry Ross FMW:[id number] DOB: Dec 07, 1965 DOA: 11/19/2024 DOS: the patient was seen and examined on 11/19/2024 PCP: Toribio Jerel MATSU, MD  Patient coming from: Home  Chief Complaint:  Chief Complaint  Patient presents with   Neck Pain   HPI: Barry Ross is a 59 y.o. 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  Patient presents with severe back pain under his left scapula that started on Wednesday.  His pain has increasingly worsened.  He did come to the ER yesterday early morning for evaluation.  He was prescribed Valium , ibuprofen , morphine .  He took Valium  a couple times during the day without improvement.  By the end of the day, his pain has radiated up into his neck causing some difficulty moving his neck due to the pain.  This morning, he went to his PCPs office for evaluation.  Due to his symptoms, he was advised to come to the ER for evaluation for meningitis.   In the emergency department, the patient had an LP done with clear CSF.  CSF testing showed slightly elevated protein, but no leukocytes, elevated glucose, etc.  MRI of neck and thoracic spine show some cervical degenerative disc disease, mild bulging disks throughout, but no cord or nerve impingement.  Blood work did show an elevated white count of 17.  While here, he did spike a fever of 103.2.  In terms of illnesses, the patient had flu over christmas and took Tamiflu.  His symptoms resolved within a few days.  He then had diarrhea all day on Monday after waking up in the middle of the night and vomiting.  By Tuesday he was better.  Wednesday he woke up with the back pain.  He did have this type of back pain a few months earlier, but it only lasted 10 hours and did not radiate up into his neck    Review of Systems: As mentioned in the history of present illness. All other systems reviewed and are negative. Past Medical  History:  Diagnosis Date   Allergic rhinitis    Allergy    Asthma    Clotting disorder    Depression    Patient denies   GERD (gastroesophageal reflux disease)    Hyperlipidemia    Past Surgical History:  Procedure Laterality Date   anal cyst removal  2015   ANAL FISTULOTOMY N/A 09/17/2019   Procedure: ANAL FISTULOTOMY;  Surgeon: Mavis Anes, MD;  Location: [facility];  Service: General;  Laterality: N/A;   BALLOON DILATION  06/07/2011   Procedure: MERRILL HODGKIN;  Surgeon: Claudis RAYMOND Rivet, MD;  Location: [facility];  Service: Endoscopy;  Laterality: N/A;   ESOPHAGOGASTRODUODENOSCOPY  06/07/2011   Procedure: ESOPHAGOGASTRODUODENOSCOPY (EGD);  Surgeon: Claudis RAYMOND Rivet, MD;  Location: [facility];  Service: Endoscopy;  Laterality: N/A;   FISSURECTOMY N/A 02/04/2020   Procedure: FISTULECTOMY;  Surgeon: Mavis Anes, MD;  Location: [facility];  Service: General;  Laterality: N/A;   SPLENECTOMY     Secondary to ITP   Social History:  reports that he has never smoked. His smokeless tobacco use includes snuff. He reports current alcohol use of about 13.0 standard drinks of alcohol per week. He reports that he does not use drugs.  Allergies[1]  Family History  Problem Relation Age of Onset   Diabetes Other    Heart disease Other    Kidney disease Other  Colon cancer Neg Hx    Esophageal cancer Neg Hx    Pancreatic cancer Neg Hx    Rectal cancer Neg Hx    Stomach cancer Neg Hx     Prior to Admission medications  Medication Sig Start Date End Date Taking? Authorizing Provider  amLODipine  (NORVASC ) 5 MG tablet Take 5 mg by mouth daily.    [provider]  Ascorbic Acid (VITAMIN C WITH ROSE HIPS) 500 MG tablet Take 500 mg by mouth daily.    [provider]  aspirin  EC 81 MG tablet Take 81 mg by mouth daily.    [provider]  atorvastatin  (LIPITOR) 40 MG tablet Take 40 mg by mouth daily.     [provider]  budesonide-formoterol (SYMBICORT)  160-4.5 MCG/ACT inhaler Inhale 1-2 puffs into the lungs daily as needed (shortness of breath).     [provider]  cholecalciferol (VITAMIN D) 25 MCG (1000 UNIT) tablet Take 1,000 Units by mouth daily.    [provider]  diazepam  (VALIUM ) 5 MG tablet Take 0.5-1 tablets (2.5-5 mg total) by mouth 2 (two) times daily. 11/18/24   Mesner, Selinda, MD  ibuprofen  (ADVIL ) 400 MG tablet Take 1 tablet (400 mg total) by mouth 3 (three) times daily. 11/18/24   Mesner, Selinda, MD  lisinopril  (ZESTRIL ) 20 MG tablet Take 20 mg by mouth daily. 11/22/19   [provider]  morphine  (MSIR) 30 MG tablet Take 0.5 tablets (15 mg total) by mouth every 4 (four) hours as needed for severe pain (pain score 7-10). 11/18/24   Mesner, Jason, MD  naphazoline-pheniramine (ALLERGY EYE) 0.025-0.3 % ophthalmic solution Place 1 drop into both eyes 3 (three) times daily as needed for eye irritation.    [provider]  naproxen (NAPROSYN) 500 MG tablet Take 500 mg by mouth 2 (two) times daily as needed for moderate pain (pain.).     [provider]  Omega-3 Fatty Acids (FISH OIL) 1200 MG CAPS Take 1,200 mg by mouth daily.    [provider]  pantoprazole  (PROTONIX ) 40 MG tablet Take 1 tablet (40 mg total) by mouth daily. 06/07/11 01/25/21  Golda Claudis PENNER, MD  sildenafil (REVATIO) 20 MG tablet Take 20-100 mg by mouth daily as needed for erectile dysfunction. 08/27/19   [provider]  triamcinolone cream (KENALOG) 0.1 % Apply 1 application topically 2 (two) times daily as needed (itching).    [provider]    Physical Exam: Vitals:   11/19/24 1230 11/19/24 1539 11/19/24 1645 11/19/24 1738  BP: 118/67 125/72 136/75   Pulse: 81 89 84   Resp: 18 18 18    Temp:  (!) 103.2 F (39.6 C) (!) 102.7 F (39.3 C) (!) 100.4 F (38 C)  TempSrc:  Oral Oral Oral  SpO2: 96% 95% 92%   Weight:      Height:       General: Middle-age male. Awake and alert and oriented x3. No  acute cardiopulmonary distress.  HEENT: Normocephalic atraumatic.  Right and left ears normal in appearance.  Pupils equal, round, reactive to light. Extraocular muscles are intact. Sclerae anicteric and noninjected.  Moist mucosal membranes. No mucosal lesions.  Neck: Neck supple without lymphadenopathy. No carotid bruits. No masses palpated.  Cardiovascular: Regular rate with normal S1-S2 sounds. No murmurs, rubs, gallops auscultated. No JVD.  Respiratory: Good respiratory effort with no wheezes, rales, rhonchi. Lungs clear to auscultation bilaterally.  No accessory muscle use. Abdomen: Soft, nontender, nondistended. Active bowel sounds. No  masses or hepatosplenomegaly  Skin: No rashes, lesions, or ulcerations.  Dry, warm to touch. 2+ dorsalis pedis and radial pulses. Musculoskeletal: No calf or leg pain. All major joints not erythematous nontender.  No upper or lower joint deformation.  Good ROM.  No contractures  Psychiatric: Intact judgment and insight. Pleasant and cooperative. Neurologic: No focal neurological deficits. Strength is 5/5 and symmetric in upper and lower extremities.  Cranial nerves II through XII are grossly intact.  The patient does have stiffness in his neck.  Kernig sign is negative.  Brudzinski's sign is negative.  Data Reviewed: Labs and imaging reviewed by me  Assessment and Plan: No notes have been filed under this hospital service. Service: Hospitalist  Principal Problem:   Sepsis (HCC) Active Problems:   Obesity with body mass index 30 or greater   Hyperlipidemia   GERD (gastroesophageal reflux disease)   Acute neck pain  Sepsis, unknown etiology Cultures pending Sepsis antibiotics: Rocephin , Flagyl , vancomycin  MRSA screen CBC in the morning Will follow CSF cultures although I doubt this is meningitis Neck and back pain Pain controlled With essentially normal CSF and negative Brudzinski's and Kernig signs, I am doubtful that this represents  meningitis. PT eval GERD Continue treatment   Advance Care Planning:   Code Status: Not on file full code  Consults: None  Family Communication: Wife present during interview and exam  Severity of Illness: The appropriate patient status for this patient is INPATIENT. Inpatient status is judged to be reasonable and necessary in order to provide the required intensity of service to ensure the patient's safety. The patient's presenting symptoms, physical exam findings, and initial radiographic and laboratory data in the context of their chronic comorbidities is felt to place them at high risk for further clinical deterioration. Furthermore, it is not anticipated that the patient will be medically stable for discharge from the hospital within 2 midnights of admission.   * I certify that at the point of admission it is my clinical judgment that the patient will require inpatient hospital care spanning beyond 2 midnights from the point of admission due to high intensity of service, high risk for further deterioration and high frequency of surveillance required.*  Author: Chistopher Mangino J Dejah Droessler, DO 11/19/2024 5:43 PM  For on call review [email or web address].      [1]  Allergies Allergen Reactions   Other Other (See Comments)    IV IG hemoglobin, says he feels like his body is going to explode   "

## 2024-11-19 NOTE — ED Notes (Signed)
 Pt placed on 2 liters [place] due to O2 stats dropping to 88% constantly.

## 2024-11-19 NOTE — ED Triage Notes (Signed)
 Pt reports that he has been seen multiple times past couple days for generalized sharp shooting back pain and neck pain. Pt states he had FLU back the week of christmas but went to PCP this morning who was concerned for meningitis and sent him back to the ED. Pt reports unable to lay flat in bed easier with a recliner. Pt reports does not have a spleen and started spiking a fever this morning. Pt states has tried the morphine  prescribed and no relief.

## 2024-11-19 NOTE — Progress Notes (Signed)
 Pharmacy Antibiotic Note  Barry Ross is a 59 y.o. male admitted on 11/19/2024 with unknown source.  Pharmacy has been consulted for vancomycin  dosing.  -hx splenectomy d/t ITP -Tm 103.9,  WBC 17.1    LP to r/o possible meninigitis  Plan: Will order Vancomycin  2000 mg IV x 1 for loading dose to be followed by 1250 mg IV q12h   Goal AUC 400-550. Expected AUC: 448 Cmin 12.5 SCr used: 0.89  Continue to assess renal fxn, cultures, length of therapy    Height: 5' 9 (175.3 cm) Weight: 96.2 kg (212 lb 1.3 oz) IBW/kg (Calculated) : 70.7  Temp (24hrs), Avg:100.9 F (38.3 C), Min:99.1 F (37.3 C), Max:103.2 F (39.6 C)  Recent Labs  Lab 11/18/24 0313 11/19/24 0837 11/19/24 1720  WBC 15.5* 17.1*  --   CREATININE 1.02 0.89  --   LATICACIDVEN  --   --  1.0    Estimated Creatinine Clearance: 104.8 mL/min (by C-G formula based on SCr of 0.89 mg/dL).    Allergies[1]  Antimicrobials this admission: ceftriaxone  1/9 x1 vancomycin  1/9  Dose adjustments this admission:    Microbiology results: 1/9 BCx: pending   UCx:      Sputum:    1/9 MRSA PCR: pending 1/9 CSF culture: pending  Thank you for allowing pharmacy to be a part of this patients care.  Allean Haas PharmD Clinical Pharmacist 11/19/2024      [1]  Allergies Allergen Reactions   Other Other (See Comments)    IV IG hemoglobin, says he feels like his body is going to explode

## 2024-11-20 ENCOUNTER — Inpatient Hospital Stay (HOSPITAL_COMMUNITY)

## 2024-11-20 DIAGNOSIS — A419 Sepsis, unspecified organism: Secondary | ICD-10-CM | POA: Diagnosis not present

## 2024-11-20 DIAGNOSIS — M542 Cervicalgia: Secondary | ICD-10-CM | POA: Diagnosis not present

## 2024-11-20 LAB — BASIC METABOLIC PANEL WITH GFR
Anion gap: 7 (ref 5–15)
BUN: 10 mg/dL (ref 6–20)
CO2: 30 mmol/L (ref 22–32)
Calcium: 8.9 mg/dL (ref 8.9–10.3)
Chloride: 100 mmol/L (ref 98–111)
Creatinine, Ser: 0.89 mg/dL (ref 0.61–1.24)
GFR, Estimated: >60 mL/min
Glucose, Bld: 94 mg/dL (ref 70–99)
Potassium: 3.6 mmol/L (ref 3.5–5.1)
Sodium: 138 mmol/L (ref 135–145)

## 2024-11-20 LAB — CBC
HCT: 35.7 % — ABNORMAL LOW (ref 39.0–52.0)
Hemoglobin: 11.9 g/dL — ABNORMAL LOW (ref 13.0–17.0)
MCH: 31.4 pg (ref 26.0–34.0)
MCHC: 33.3 g/dL (ref 30.0–36.0)
MCV: 94.2 fL (ref 80.0–100.0)
Platelets: 432 K/uL — ABNORMAL HIGH (ref 150–400)
RBC: 3.79 MIL/uL — ABNORMAL LOW (ref 4.22–5.81)
RDW: 13.4 % (ref 11.5–15.5)
WBC: 18.2 K/uL — ABNORMAL HIGH (ref 4.0–10.5)
nRBC: 0 % (ref 0.0–0.2)

## 2024-11-20 LAB — PROTIME-INR
INR: 1.1 (ref 0.8–1.2)
Prothrombin Time: 15 s (ref 11.4–15.2)

## 2024-11-20 LAB — LACTIC ACID, PLASMA: Lactic Acid, Venous: 0.6 mmol/L (ref 0.5–1.9)

## 2024-11-20 LAB — CORTISOL-AM, BLOOD: Cortisol - AM: 6.3 ug/dL — ABNORMAL LOW (ref 6.7–22.6)

## 2024-11-20 LAB — HIV ANTIBODY (ROUTINE TESTING W REFLEX): HIV Screen 4th Generation wRfx: NONREACTIVE

## 2024-11-20 MED ORDER — METHOCARBAMOL 500 MG PO TABS
500.0000 mg | ORAL_TABLET | Freq: Three times a day (TID) | ORAL | Status: DC
  Administered 2024-11-20 – 2024-11-22 (×7): 500 mg via ORAL
  Filled 2024-11-20 (×7): qty 1

## 2024-11-20 MED ORDER — IOHEXOL 350 MG/ML SOLN
75.0000 mL | Freq: Once | INTRAVENOUS | Status: AC | PRN
  Administered 2024-11-20: 75 mL via INTRAVENOUS

## 2024-11-20 MED ORDER — SENNOSIDES-DOCUSATE SODIUM 8.6-50 MG PO TABS
2.0000 | ORAL_TABLET | Freq: Every evening | ORAL | Status: DC | PRN
  Administered 2024-11-20 – 2024-11-21 (×2): 2 via ORAL
  Filled 2024-11-20 (×2): qty 2

## 2024-11-20 NOTE — Progress Notes (Addendum)
 " PROGRESS NOTE    Barry Ross  FMW:[id number] DOB: [birth year] DOA: 11/19/2024 PCP: Toribio Jerel MATSU, MD   Brief Narrative:   HPI: Barry Ross is a 59 y.o. 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  Patient presents with severe back pain under his left scapula that started on Wednesday.  His pain has increasingly worsened.  He did come to the ER yesterday early morning for evaluation.  He was prescribed Valium , ibuprofen , morphine .  He took Valium  a couple times during the day without improvement.  By the end of the day, his pain has radiated up into his neck causing some difficulty moving his neck due to the pain.  This morning, he went to his PCPs office for evaluation.  Due to his symptoms, he was advised to come to the ER for evaluation for meningitis.    In the emergency department, the patient had an LP done with clear CSF.  CSF testing showed slightly elevated protein, but no leukocytes, elevated glucose, etc.  MRI of neck and thoracic spine show some cervical degenerative disc disease, mild bulging disks throughout, but no cord or nerve impingement.  Blood work did show an elevated white count of 17.  While here, he did spike a fever of 103.2.   In terms of illnesses, the patient had flu over christmas and took Tamiflu.  His symptoms resolved within a few days.  He then had diarrhea all day on Monday after waking up in the middle of the night and vomiting.  By Tuesday he was better.  Wednesday he woke up with the back pain.  He did have this type of back pain a few months earlier, but it only lasted 10 hours and did not radiate up into his neck    1/10 Patient continues to have pain on his upper back as well as stiffening of neck.  He has difficulty moving his neck side wise.  Denies photophobia.  Vital signs are stable.  No fever overnight.  No chills.   Assessment & Plan:   Principal Problem:   Sepsis  (HCC) Active Problems:   Obesity with body mass index 30 or greater   Hyperlipidemia   GERD (gastroesophageal reflux disease)   Acute neck pain  Fever and leukocytosis concern for sepsis of unknown etiology: LP completed in the ER, CSF analysis not consistent with meningoencephalitis, culture pending Will continue Rocephin , Flagyl  and vancomycin  for now.  Blood culture is also pending. Leukocytosis is persistent  Neck/back pain and spasm: Chest pain Will add Robaxin  to see if that helps. Continue Dilaudid  as needed, oral. With essentially normal CSF, this does not represent meningoencephalitis. MRI cervical thoracic spine shows multilevel degenerative disease. Will obtain CT PE protocol  GERD: Continue treatment  Hypertension: Lisinopril   History of splenectomy for ITP, noted    Advance Care Planning:   Code Status: Not on file full code   Consults: None   Family Communication: Wife present during interview and exam   Severity of Illness: The appropriate patient status for this patient is INPATIENT. Inpatient status is judged to be reasonable and necessary in order to provide the required intensity of service to ensure the patient's safety. The patient's presenting symptoms, physical exam findings, and initial radiographic and laboratory data in the context of their chronic comorbidities is felt to place them at high risk for further clinical deterioration. Furthermore, it is not anticipated that the patient will be  medically stable for discharge from the hospital within 2 midnights of admission.    * I certify that at the point of admission it is my clinical judgment that the patient will require inpatient hospital care spanning beyond 2 midnights from the point of admission due to high intensity of service, high risk for further deterioration and high frequency of surveillance    DVT prophylaxis: Lovenox  Code Status: Full code Family Communication: The bedside Disposition  Plan: Status is: Inpatient Remains inpatient appropriate because: Ongoing antibiotic therapy, requiring IV analgesics, monitoring cultures  Subjective:  Still having neck pain/spasm, patient feels difficulty moving his neck sideways.  No fever overnight.  Objective: Vitals:   11/19/24 1832 11/19/24 2223 11/20/24 0300 11/20/24 0636  BP: 118/71 123/84 114/65 124/72  Pulse: 89 90 80 68  Resp: 19 18  18   Temp: (!) 100.4 F (38 C) (!) 103.1 F (39.5 C) 100 F (37.8 C) 98.5 F (36.9 C)  TempSrc:  Oral Oral Oral  SpO2: 90% 93% (!) 88% 99%  Weight:      Height:        Intake/Output Summary (Last 24 hours) at 11/20/2024 1031 Last data filed at 11/20/2024 0300 Gross per 24 hour  Intake 1363.3 ml  Output --  Net 1363.3 ml   Filed Weights   11/19/24 0758  Weight: 96.2 kg    Examination:  General: Alert, oriented not in any acute distress HEENT: No focal tenderness, patient having difficulty moving neck sideways Chest: Clear CVs: S1, S2, no murmur, regular rhythm Extremities: No edema  Data Reviewed: I have personally reviewed following labs and imaging studies  CBC: Recent Labs  Lab 11/18/24 0313 11/19/24 0837 11/20/24 0740  WBC 15.5* 17.1* 18.2*  NEUTROABS 9.3* 11.1*  --   HGB 13.7 13.7 11.9*  HCT 38.6* 39.9 35.7*  MCV 90.4 92.1 94.2  PLT 470* 484* 432*   Basic Metabolic Panel: Recent Labs  Lab 11/18/24 0313 11/19/24 0837 11/20/24 0740  NA 137 139 138  K 4.0 3.7 3.6  CL 100 101 100  CO2 23 23 30   GLUCOSE 98 97 94  BUN 14 8 10   CREATININE 1.02 0.89 0.89  CALCIUM  9.8 9.6 8.9   GFR: Estimated Creatinine Clearance: 104.8 mL/min (by C-G formula based on SCr of 0.89 mg/dL). Liver Function Tests: Recent Labs  Lab 11/18/24 0313 11/19/24 1720  AST 24 22  ALT 23 20  ALKPHOS 82 74  BILITOT 0.6 0.9  PROT 7.6 7.6  ALBUMIN 4.5 4.4   No results for input(s): LIPASE, AMYLASE in the last 168 hours. No results for input(s): AMMONIA in the last 168  hours. Coagulation Profile: Recent Labs  Lab 11/20/24 0740  INR 1.1   Cardiac Enzymes: No results for input(s): CKTOTAL, CKMB, CKMBINDEX, TROPONINI in the last 168 hours. BNP (last 3 results) No results for input(s): PROBNP in the last 8760 hours. HbA1C: No results for input(s): HGBA1C in the last 72 hours. CBG: No results for input(s): GLUCAP in the last 168 hours. Lipid Profile: No results for input(s): CHOL, HDL, LDLCALC, TRIG, CHOLHDL, LDLDIRECT in the last 72 hours. Thyroid Function Tests: No results for input(s): TSH, T4TOTAL, FREET4, T3FREE, THYROIDAB in the last 72 hours. Anemia Panel: No results for input(s): VITAMINB12, FOLATE, FERRITIN, TIBC, IRON, RETICCTPCT in the last 72 hours. Sepsis Labs: Recent Labs  Lab 11/19/24 1720 11/20/24 0740  LATICACIDVEN 1.0 0.6    Recent Results (from the past 240 hours)  CSF culture  Status: None (Preliminary result)   Collection Time: 11/19/24  2:33 PM   Specimen: Back; Cerebrospinal Fluid  Result Value Ref Range Status   Specimen Description   Final    BACK Performed at [facility], [address] [place], [place] [address]    Special Requests   Final    NONE Performed at [facility], [address] [place], [place] [address]    Gram Stain   Final    NO ORGANISMS SEEN CSF CYTOSPIN SMEAR NO WBC SEEN Gram Stain Report Called to,Read Back By and Verified With: BRYANT S @ 1627 ON [id number]  BY HENDERSON L Performed at [facility], [address], [place], [place] [address]    Culture   Final    NO GROWTH < 24 HOURS Performed at [facility], [address] [place], [place] [address]    Report Status PENDING  Incomplete  Resp panel by RT-PCR (RSV, Flu A&B, Covid) Anterior Nasal Swab     Status: None   Collection Time: 11/19/24  4:49 PM   Specimen: Anterior Nasal Swab  Result Value Ref Range Status   SARS Coronavirus 2 by RT PCR NEGATIVE NEGATIVE Final     Comment: (NOTE) SARS-CoV-2 target nucleic acids are NOT DETECTED.  The SARS-CoV-2 RNA is generally detectable in upper respiratory specimens during the acute phase of infection. The lowest concentration of SARS-CoV-2 viral copies this assay can detect is 138 copies/mL. A negative result does not preclude SARS-Cov-2 infection and should not be used as the sole basis for treatment or other patient management decisions. A negative result may occur with  improper specimen collection/handling, submission of specimen other than nasopharyngeal swab, presence of viral mutation(s) within the areas targeted by this assay, and inadequate number of viral copies(<138 copies/mL). A negative result must be combined with clinical observations, patient history, and epidemiological information. The expected result is Negative.  Fact Sheet for Patients:  bloggercourse.com  Fact Sheet for Healthcare Providers:  seriousbroker.it  This test is no t yet approved or cleared by the [place]  FDA and  has been authorized for detection and/or diagnosis of SARS-CoV-2 by FDA under an Emergency Use Authorization (EUA). This EUA will remain  in effect (meaning this test can be used) for the duration of the COVID-19 declaration under Section 564(b)(1) of the Act, 21 U.S.C.section 360bbb-3(b)(1), unless the authorization is terminated  or revoked sooner.       Influenza A by PCR NEGATIVE NEGATIVE Final   Influenza B by PCR NEGATIVE NEGATIVE Final    Comment: (NOTE) The Xpert Xpress SARS-CoV-2/FLU/RSV plus assay is intended as an aid in the diagnosis of influenza from Nasopharyngeal swab specimens and should not be used as a sole basis for treatment. Nasal washings and aspirates are unacceptable for Xpert Xpress SARS-CoV-2/FLU/RSV testing.  Fact Sheet for Patients: bloggercourse.com  Fact Sheet for Healthcare  Providers: seriousbroker.it  This test is not yet approved or cleared by the [place]  FDA and has been authorized for detection and/or diagnosis of SARS-CoV-2 by FDA under an Emergency Use Authorization (EUA). This EUA will remain in effect (meaning this test can be used) for the duration of the COVID-19 declaration under Section 564(b)(1) of the Act, 21 U.S.C. section 360bbb-3(b)(1), unless the authorization is terminated or revoked.     Resp Syncytial Virus by PCR NEGATIVE NEGATIVE Final    Comment: (NOTE) Fact Sheet for Patients: bloggercourse.com  Fact Sheet for Healthcare Providers: seriousbroker.it  This test is not yet  approved or cleared by the [place]  FDA and has been authorized for detection and/or diagnosis of SARS-CoV-2 by FDA under an Emergency Use Authorization (EUA). This EUA will remain in effect (meaning this test can be used) for the duration of the COVID-19 declaration under Section 564(b)(1) of the Act, 21 U.S.C. section 360bbb-3(b)(1), unless the authorization is terminated or revoked.  Performed at [facility], [address], [place], [place] [address]   Blood culture (routine x 2)     Status: None (Preliminary result)   Collection Time: 11/19/24  5:20 PM   Specimen: BLOOD LEFT HAND  Result Value Ref Range Status   Specimen Description BLOOD LEFT HAND  Final   Special Requests   Final    BOTTLES DRAWN AEROBIC AND ANAEROBIC Blood Culture adequate volume   Culture   Final    NO GROWTH < 24 HOURS Performed at [facility], [address], [place], [place] [address]    Report Status PENDING  Incomplete  Blood culture (routine x 2)     Status: None (Preliminary result)   Collection Time: 11/19/24  5:20 PM   Specimen: BLOOD LEFT HAND  Result Value Ref Range Status   Specimen Description BLOOD LEFT HAND  Final   Special Requests   Final    BOTTLES DRAWN AEROBIC AND  ANAEROBIC Blood Culture adequate volume   Culture   Final    NO GROWTH < 24 HOURS Performed at [facility], [address] [place], [place] [address]    Report Status PENDING  Incomplete         Radiology Studies: MR THORACIC SPINE W WO CONTRAST Result Date: 11/19/2024 CLINICAL DATA:  Neck and back pain EXAM: MRI THORACIC WITHOUT AND WITH CONTRAST TECHNIQUE: Multiplanar and multiecho pulse sequences of the thoracic spine were obtained without and with intravenous contrast. CONTRAST:  9mL GADAVIST  GADOBUTROL  1 MMOL/ML IV SOLN COMPARISON:  None Available. FINDINGS: Alignment: Normal Bone marrow signal: No significant abnormality Thoracic spinal cord: Normal Facet joints: No significant abnormality Intervertebral discs: There is a central disc herniation at L1-L2 without significant spinal stenosis. No thoracic disc herniation Paraspinal tissues: No significant abnormality IMPRESSION: 1. No significant abnormality in the thoracic spine 2. Central L1-L2 disc herniation without spinal stenosis Electronically Signed   By: Nancyann Burns M.D.   On: 11/19/2024 09:58   MR Cervical Spine W and Wo Contrast Result Date: 11/19/2024 CLINICAL DATA:  Neck and back pain EXAM: MRI CERVICAL SPINE WITHOUT AND WITH CONTRAST TECHNIQUE: Multiplanar and multiecho pulse sequences of the cervical spine, to include the craniocervical junction and cervicothoracic junction, were obtained without and with intravenous contrast. CONTRAST:  9mL GADAVIST  GADOBUTROL  1 MMOL/ML IV SOLN COMPARISON:  None Available. FINDINGS: The craniocervical junction is normal. There is no significant bone marrow signal abnormality. The cervical spinal cord is normal. C2-C3: Mild disc bulge, otherwise normal C3-C4: Mild disc bulge. Small foraminal spurs with mild bilateral neural foraminal stenosis. The facet joints are normal C4-C5: There is moderate degenerative disc disease. Bilateral foraminal spurs, larger on the left. There is moderate left neural  foraminal stenosis. No significant facet disease C5-C6: There is severe degenerative disc disease with a mild disc bulge. Bilateral foraminal spurs with moderate bilateral neural foraminal stenosis. There is effacement of the thecal sac without compression of the cord, moderate spinal stenosis C6-C7: There is severe degenerative disc disease with a mild disc bulge. There is a right disc/foraminal spur and a small left foraminal spur. Moderate right neural foraminal  stenosis. Mild spinal stenosis C7-T1: The disc is normal. Mild left facet arthropathy. No spinal stenosis or foraminal stenosis IMPRESSION: Cervical spondylosis. There is moderate spinal stenosis at C5-6. Multilevel neural foraminal stenosis as outlined above. Electronically Signed   By: Nancyann Burns M.D.   On: 11/19/2024 09:55        Scheduled Meds:  amLODipine   5 mg Oral Daily   aspirin  EC  81 mg Oral Daily   atorvastatin   40 mg Oral Daily   enoxaparin  (LOVENOX ) injection  40 mg Subcutaneous Q24H   fluticasone  furoate-vilanterol  1 puff Inhalation Daily   lidocaine  (PF)  2 mL     lisinopril   20 mg Oral Daily   pantoprazole   40 mg Oral Daily   Continuous Infusions:  cefTRIAXone  (ROCEPHIN )  IV     lactated ringers  150 mL/hr (11/20/24 0400)   metronidazole  500 mg (11/20/24 9161)   vancomycin  1,250 mg (11/20/24 1004)          Derryl Duval, MD [facility] 11/20/2024, 10:31 AM   "

## 2024-11-20 NOTE — Progress Notes (Signed)
 Patient declined Breo DPI stating he did not use it at home and did not want it.

## 2024-11-20 NOTE — Plan of Care (Signed)
" °  Problem: Education: Goal: Knowledge of General Education information will improve Description: Including pain rating scale, medication(s)/side effects and non-pharmacologic comfort measures Outcome: Progressing   Problem: Health Behavior/Discharge Planning: Goal: Ability to manage health-related needs will improve Outcome: Progressing   Problem: Clinical Measurements: Goal: Ability to maintain clinical measurements within normal limits will improve Outcome: Progressing Goal: Will remain free from infection Outcome: Progressing Goal: Diagnostic test results will improve Outcome: Progressing Goal: Respiratory complications will improve Outcome: Progressing Goal: Cardiovascular complication will be avoided Outcome: Progressing   Problem: Activity: Goal: Risk for activity intolerance will decrease Outcome: Progressing   Problem: Nutrition: Goal: Adequate nutrition will be maintained Outcome: Progressing   Problem: Pain Managment: Goal: General experience of comfort will improve and/or be controlled Outcome: Progressing   Problem: Safety: Goal: Ability to remain free from injury will improve Outcome: Progressing   Problem: Clinical Measurements: Goal: Diagnostic test results will improve Outcome: Progressing Goal: Signs and symptoms of infection will decrease Outcome: Progressing   Problem: Respiratory: Goal: Ability to maintain adequate ventilation will improve Outcome: Progressing   "

## 2024-11-20 NOTE — Plan of Care (Signed)

## 2024-11-20 NOTE — Plan of Care (Signed)

## 2024-11-20 NOTE — Progress Notes (Signed)
" °   11/19/24 2223  Vitals  Temp (!) 103.1 F (39.5 C)  Temp Source Oral  BP 123/84  MAP (mmHg) 93  BP Location Left Arm  BP Method Automatic  Patient Position (if appropriate) Sitting  Pulse Rate 90  Pulse Rate Source Dinamap  Resp 18  Level of Consciousness  Level of Consciousness Alert  MEWS COLOR  MEWS Score Color Yellow  Oxygen Therapy  SpO2 93 %  O2 Device Room Air  Patient Activity (if Appropriate) In bed  Pulse Oximetry Type Intermittent  Pain Assessment  Pain Scale 0-10  Pain Score 9  Pain Type Acute pain  Pain Location Back  Glasgow Coma Scale  Eye Opening 4  Best Verbal Response (NON-intubated) 5  Best Motor Response 6  Glasgow Coma Scale Score 15  MEWS Score  MEWS Temp 2  MEWS Systolic 0  MEWS Pulse 0  MEWS RR 0  MEWS LOC 0  MEWS Score 2  Provider Notification  Provider Name/Title Erminio Blumenthal NP  Date Provider Notified 11/19/24  Time Provider Notified 2223  Method of Notification Page  Notification Reason Other (Comment) (yellow MEWS)  Provider response See new orders  Date of Provider Response 11/19/24  Time of Provider Response 2230   Tylenol  ordered per pt request; refused Toradol  "

## 2024-11-20 NOTE — Progress Notes (Signed)
 11/20/24  Therapist entered room pt is up walking in room without assistive device with his wife in the room.  States he has been getting up out of bed and walking to bathroom.  States his limitation is pain and he does not want to do much when he is in so much pain.  Pt denies the need for therapy at this time.  Therapy will discharge.  If circumstances change we will be happy to see this patient.   Montie Metro, PT CLT [phone]

## 2024-11-21 DIAGNOSIS — A419 Sepsis, unspecified organism: Secondary | ICD-10-CM | POA: Diagnosis not present

## 2024-11-21 LAB — CBC WITH DIFFERENTIAL/PLATELET
Abs Immature Granulocytes: 0.07 K/uL (ref 0.00–0.07)
Basophils Absolute: 0.1 K/uL (ref 0.0–0.1)
Basophils Relative: 1 %
Eosinophils Absolute: 0.4 K/uL (ref 0.0–0.5)
Eosinophils Relative: 3 %
HCT: 34.7 % — ABNORMAL LOW (ref 39.0–52.0)
Hemoglobin: 11.8 g/dL — ABNORMAL LOW (ref 13.0–17.0)
Immature Granulocytes: 0 %
Lymphocytes Relative: 23 %
Lymphs Abs: 3.6 K/uL (ref 0.7–4.0)
MCH: 31.8 pg (ref 26.0–34.0)
MCHC: 34 g/dL (ref 30.0–36.0)
MCV: 93.5 fL (ref 80.0–100.0)
Monocytes Absolute: 2.7 K/uL — ABNORMAL HIGH (ref 0.1–1.0)
Monocytes Relative: 17 %
Neutro Abs: 9.1 K/uL — ABNORMAL HIGH (ref 1.7–7.7)
Neutrophils Relative %: 56 %
Platelets: 430 K/uL — ABNORMAL HIGH (ref 150–400)
RBC: 3.71 MIL/uL — ABNORMAL LOW (ref 4.22–5.81)
RDW: 13.1 % (ref 11.5–15.5)
WBC: 16 K/uL — ABNORMAL HIGH (ref 4.0–10.5)
nRBC: 0 % (ref 0.0–0.2)

## 2024-11-21 MED ORDER — FLUTICASONE FUROATE-VILANTEROL 200-25 MCG/ACT IN AEPB: 1.0000 | INHALATION_SPRAY | Freq: Every day | RESPIRATORY_TRACT | Status: DC | PRN

## 2024-11-21 NOTE — Plan of Care (Signed)
" °  Problem: Education: Goal: Knowledge of General Education information will improve Description: Including pain rating scale, medication(s)/side effects and non-pharmacologic comfort measures Outcome: Progressing   Problem: Health Behavior/Discharge Planning: Goal: Ability to manage health-related needs will improve Outcome: Progressing   Problem: Clinical Measurements: Goal: Ability to maintain clinical measurements within normal limits will improve Outcome: Progressing Goal: Will remain free from infection Outcome: Progressing Goal: Diagnostic test results will improve Outcome: Progressing Goal: Respiratory complications will improve Outcome: Progressing Goal: Cardiovascular complication will be avoided Outcome: Progressing   Problem: Activity: Goal: Risk for activity intolerance will decrease Outcome: Progressing   Problem: Nutrition: Goal: Adequate nutrition will be maintained Outcome: Progressing   Problem: Coping: Goal: Level of anxiety will decrease Outcome: Progressing   Problem: Elimination: Goal: Will not experience complications related to bowel motility Outcome: Progressing Goal: Will not experience complications related to urinary retention Outcome: Progressing   Problem: Pain Managment: Goal: General experience of comfort will improve and/or be controlled Outcome: Progressing   Problem: Safety: Goal: Ability to remain free from injury will improve Outcome: Progressing   Problem: Skin Integrity: Goal: Risk for impaired skin integrity will decrease Outcome: Progressing   Problem: Fluid Volume: Goal: Hemodynamic stability will improve Outcome: Progressing   Problem: Respiratory: Goal: Ability to maintain adequate ventilation will improve Outcome: Progressing   Problem: Clinical Measurements: Goal: Diagnostic test results will improve Outcome: Progressing Goal: Signs and symptoms of infection will decrease Outcome: Progressing   "

## 2024-11-21 NOTE — Progress Notes (Signed)
 " PROGRESS NOTE    SHAQUEL CHAVOUS  FMW:[id number] DOB: September 04, 1966 DOA: 11/19/2024 PCP: Toribio Jerel MATSU, MD   Brief Narrative:   HPI: Barry Ross is a 59 y.o. 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  Patient presents with severe back pain under his left scapula that started on Wednesday.  His pain has increasingly worsened.  He did come to the ER yesterday early morning for evaluation.  He was prescribed Valium , ibuprofen , morphine .  He took Valium  a couple times during the day without improvement.  By the end of the day, his pain has radiated up into his neck causing some difficulty moving his neck due to the pain.  This morning, he went to his PCPs office for evaluation.  Due to his symptoms, he was advised to come to the ER for evaluation for meningitis.    In the emergency department, the patient had an LP done with clear CSF.  CSF testing showed slightly elevated protein, but no leukocytes, elevated glucose, etc.  MRI of neck and thoracic spine show some cervical degenerative disc disease, mild bulging disks throughout, but no cord or nerve impingement.  Blood work did show an elevated white count of 17.  While here, he did spike a fever of 103.2.   In terms of illnesses, the patient had flu over christmas and took Tamiflu.  His symptoms resolved within a few days.  He then had diarrhea all day on Monday after waking up in the middle of the night and vomiting.  By Tuesday he was better.  Wednesday he woke up with the back pain.  He did have this type of back pain a few months earlier, but it only lasted 10 hours and did not radiate up into his neck    1/10 Patient continues to have pain on his upper back as well as stiffening of neck.  He has difficulty moving his neck side wise.  Denies photophobia.  Vital signs are stable.  No fever overnight.  No chills. 1/11: Neck stiffness slightly better.  Still using frequent  IV Dilaudid .  Cultures negative so far.  No fever, temperature maximum 99.4 last night   Assessment & Plan:   Principal Problem:   Sepsis (HCC) Active Problems:   Obesity with body mass index 30 or greater   Hyperlipidemia   GERD (gastroesophageal reflux disease)   Acute neck pain  Fever and leukocytosis concern for sepsis of unknown etiology: LP completed in the ER, CSF analysis not consistent with meningoencephalitis, culture pending Will continue Rocephin , Flagyl  and vancomycin  for now.  Blood culture is also pending. CSF PCR negative, influenza A, influenza B, RSV and COVID-19 negative Leukocytosis is persistent Monitor fever curve  Neck/back pain and spasm: Chest pain CT PE protocol is negative for acute findings, mildly dilated pulmonary artery could represent pulmonary hypertension Continue Dilaudid  as needed, oral Robaxin .  Ketorolac .  Will add Norco as needed With essentially normal CSF, this does not represent meningoencephalitis. MRI cervical thoracic spine shows multilevel degenerative disease.   GERD: Continue treatment  Hypertension: Lisinopril   History of splenectomy for ITP, noted    Advance Care Planning:   Code Status: Not on file full code   Consults: None   Family Communication:    Severity of Illness: The appropriate patient status for this patient is INPATIENT. Inpatient status is judged to be reasonable and necessary in order to provide the required intensity of service to  ensure the patient's safety. The patient's presenting symptoms, physical exam findings, and initial radiographic and laboratory data in the context of their chronic comorbidities is felt to place them at high risk for further clinical deterioration. Furthermore, it is not anticipated that the patient will be medically stable for discharge from the hospital within 2 midnights of admission.    * I certify that at the point of admission it is my clinical judgment that the patient will  require inpatient hospital care spanning beyond 2 midnights from the point of admission due to high intensity of service, high risk for further deterioration and high frequency of surveillance    DVT prophylaxis: Lovenox  Code Status: Full code Family Communication: The bedside Disposition Plan: Status is: Inpatient Remains inpatient appropriate because: Ongoing antibiotic therapy, requiring IV analgesics, monitoring cultures  Subjective:  Still having neck pain/spasm, maybe slightly better than yesterday but is still requiring IV Dilaudid  frequently.  Tmax 99.44 F overnight Objective: Vitals:   11/20/24 0636 11/20/24 1354 11/20/24 2027 11/21/24 0447  BP: 124/72 131/75 (!) 148/75 135/73  Pulse: 68 87 79 63  Resp: 18 18 18 19   Temp: 98.5 F (36.9 C) 99.3 F (37.4 C) 99.4 F (37.4 C) 97.9 F (36.6 C)  TempSrc: Oral Oral Oral Oral  SpO2: 99% 92% 92% 95%  Weight:      Height:        Intake/Output Summary (Last 24 hours) at 11/21/2024 0959 Last data filed at 11/21/2024 0400 Gross per 24 hour  Intake 789.34 ml  Output --  Net 789.34 ml   Filed Weights   11/19/24 0758  Weight: 96.2 kg    Examination:  General: Alert, oriented not in any acute distress HEENT: No focal tenderness, patient having difficulty moving neck sideways Chest: Clear CVs: S1, S2, no murmur, regular rhythm Extremities: No edema  Data Reviewed: I have personally reviewed following labs and imaging studies  CBC: Recent Labs  Lab 11/18/24 0313 11/19/24 0837 11/20/24 0740 11/21/24 0526  WBC 15.5* 17.1* 18.2* 16.0*  NEUTROABS 9.3* 11.1*  --  9.1*  HGB 13.7 13.7 11.9* 11.8*  HCT 38.6* 39.9 35.7* 34.7*  MCV 90.4 92.1 94.2 93.5  PLT 470* 484* 432* 430*   Basic Metabolic Panel: Recent Labs  Lab 11/18/24 0313 11/19/24 0837 11/20/24 0740  NA 137 139 138  K 4.0 3.7 3.6  CL 100 101 100  CO2 23 23 30   GLUCOSE 98 97 94  BUN 14 8 10   CREATININE 1.02 0.89 0.89  CALCIUM  9.8 9.6 8.9    GFR: Estimated Creatinine Clearance: 104.8 mL/min (by C-G formula based on SCr of 0.89 mg/dL). Liver Function Tests: Recent Labs  Lab 11/18/24 0313 11/19/24 1720  AST 24 22  ALT 23 20  ALKPHOS 82 74  BILITOT 0.6 0.9  PROT 7.6 7.6  ALBUMIN 4.5 4.4   No results for input(s): LIPASE, AMYLASE in the last 168 hours. No results for input(s): AMMONIA in the last 168 hours. Coagulation Profile: Recent Labs  Lab 11/20/24 0740  INR 1.1   Cardiac Enzymes: No results for input(s): CKTOTAL, CKMB, CKMBINDEX, TROPONINI in the last 168 hours. BNP (last 3 results) No results for input(s): PROBNP in the last 8760 hours. HbA1C: No results for input(s): HGBA1C in the last 72 hours. CBG: No results for input(s): GLUCAP in the last 168 hours. Lipid Profile: No results for input(s): CHOL, HDL, LDLCALC, TRIG, CHOLHDL, LDLDIRECT in the last 72 hours. Thyroid Function Tests: No results for input(s): TSH,  T4TOTAL, FREET4, T3FREE, THYROIDAB in the last 72 hours. Anemia Panel: No results for input(s): VITAMINB12, FOLATE, FERRITIN, TIBC, IRON, RETICCTPCT in the last 72 hours. Sepsis Labs: Recent Labs  Lab 11/19/24 1720 11/20/24 0740  LATICACIDVEN 1.0 0.6    Recent Results (from the past 240 hours)  CSF culture     Status: None (Preliminary result)   Collection Time: 11/19/24  2:33 PM   Specimen: Back; Cerebrospinal Fluid  Result Value Ref Range Status   Specimen Description   Final    BACK Performed at [facility], [address], [place], [place] [address]    Special Requests   Final    NONE Performed at [facility], [address] [place], [place] [address]    Gram Stain   Final    NO ORGANISMS SEEN CSF CYTOSPIN SMEAR NO WBC SEEN Gram Stain Report Called to,Read Back By and Verified With: BRYANT S @ 1627 ON [id number]  BY HENDERSON L Performed at [facility], [address] [place], [place] [address]    Culture   Final     NO GROWTH < 24 HOURS Performed at [facility], [address] [place], [place] [address]    Report Status PENDING  Incomplete  Resp panel by RT-PCR (RSV, Flu A&B, Covid) Anterior Nasal Swab     Status: None   Collection Time: 11/19/24  4:49 PM   Specimen: Anterior Nasal Swab  Result Value Ref Range Status   SARS Coronavirus 2 by RT PCR NEGATIVE NEGATIVE Final    Comment: (NOTE) SARS-CoV-2 target nucleic acids are NOT DETECTED.  The SARS-CoV-2 RNA is generally detectable in upper respiratory specimens during the acute phase of infection. The lowest concentration of SARS-CoV-2 viral copies this assay can detect is 138 copies/mL. A negative result does not preclude SARS-Cov-2 infection and should not be used as the sole basis for treatment or other patient management decisions. A negative result may occur with  improper specimen collection/handling, submission of specimen other than nasopharyngeal swab, presence of viral mutation(s) within the areas targeted by this assay, and inadequate number of viral copies(<138 copies/mL). A negative result must be combined with clinical observations, patient history, and epidemiological information. The expected result is Negative.  Fact Sheet for Patients:  bloggercourse.com  Fact Sheet for Healthcare Providers:  seriousbroker.it  This test is no t yet approved or cleared by the [place]  FDA and  has been authorized for detection and/or diagnosis of SARS-CoV-2 by FDA under an Emergency Use Authorization (EUA). This EUA will remain  in effect (meaning this test can be used) for the duration of the COVID-19 declaration under Section 564(b)(1) of the Act, 21 U.S.C.section 360bbb-3(b)(1), unless the authorization is terminated  or revoked sooner.       Influenza A by PCR NEGATIVE NEGATIVE Final   Influenza B by PCR NEGATIVE NEGATIVE Final    Comment: (NOTE) The Xpert Xpress  SARS-CoV-2/FLU/RSV plus assay is intended as an aid in the diagnosis of influenza from Nasopharyngeal swab specimens and should not be used as a sole basis for treatment. Nasal washings and aspirates are unacceptable for Xpert Xpress SARS-CoV-2/FLU/RSV testing.  Fact Sheet for Patients: bloggercourse.com  Fact Sheet for Healthcare Providers: seriousbroker.it  This test is not yet approved or cleared by the [place]  FDA and has been authorized for detection and/or diagnosis of SARS-CoV-2 by FDA under an Emergency Use Authorization (EUA). This EUA will remain in effect (meaning this test can be used) for the  duration of the COVID-19 declaration under Section 564(b)(1) of the Act, 21 U.S.C. section 360bbb-3(b)(1), unless the authorization is terminated or revoked.     Resp Syncytial Virus by PCR NEGATIVE NEGATIVE Final    Comment: (NOTE) Fact Sheet for Patients: bloggercourse.com  Fact Sheet for Healthcare Providers: seriousbroker.it  This test is not yet approved or cleared by the [place]  FDA and has been authorized for detection and/or diagnosis of SARS-CoV-2 by FDA under an Emergency Use Authorization (EUA). This EUA will remain in effect (meaning this test can be used) for the duration of the COVID-19 declaration under Section 564(b)(1) of the Act, 21 U.S.C. section 360bbb-3(b)(1), unless the authorization is terminated or revoked.  Performed at [facility], [address] [place], [place] [address]   Blood culture (routine x 2)     Status: None (Preliminary result)   Collection Time: 11/19/24  5:20 PM   Specimen: BLOOD LEFT HAND  Result Value Ref Range Status   Specimen Description BLOOD LEFT HAND  Final   Special Requests   Final    BOTTLES DRAWN AEROBIC AND ANAEROBIC Blood Culture adequate volume   Culture   Final    NO GROWTH 2 DAYS Performed at [facility], [address], [place], [place] [address]    Report Status PENDING  Incomplete  Blood culture (routine x 2)     Status: None (Preliminary result)   Collection Time: 11/19/24  5:20 PM   Specimen: BLOOD LEFT HAND  Result Value Ref Range Status   Specimen Description BLOOD LEFT HAND  Final   Special Requests   Final    BOTTLES DRAWN AEROBIC AND ANAEROBIC Blood Culture adequate volume   Culture   Final    NO GROWTH 2 DAYS Performed at [facility], [address] [place], [place] [address]    Report Status PENDING  Incomplete         Radiology Studies: CT Angio Chest Pulmonary Embolism (PE) W or WO Contrast Result Date: 11/20/2024 CLINICAL DATA:  Severe back pain under left scapula. EXAM: CT ANGIOGRAPHY CHEST WITH CONTRAST TECHNIQUE: Multidetector CT imaging of the chest was performed using the standard protocol during bolus administration of intravenous contrast. Multiplanar CT image reconstructions and MIPs were obtained to evaluate the vascular anatomy. RADIATION DOSE REDUCTION: This exam was performed according to the departmental dose-optimization program which includes automated exposure control, adjustment of the mA and/or kV according to patient size and/or use of iterative reconstruction technique. CONTRAST:  75mL OMNIPAQUE  IOHEXOL  350 MG/ML SOLN COMPARISON:  None Available. FINDINGS: Cardiovascular: Negative for pulmonary embolus. Atherosclerotic calcification of the aorta with age advanced involvement of the left anterior descending coronary artery. Enlarged pulmonic trunk and heart. No pericardial effusion. Mediastinum/Nodes: No pathologically enlarged mediastinal, hilar or axillary lymph nodes. Esophagus is grossly unremarkable. Lungs/Pleura: Image quality is degraded by expiratory phase imaging. Scattered subsegmental atelectasis with dependent atelectasis in the lower lobes. No pleural fluid. Airway is unremarkable. Upper Abdomen: Right hemidiaphragm is slightly elevated.  Visualized portions of the liver, adrenal glands, left kidney, spleen, pancreas, stomach and bowel are grossly unremarkable. No upper abdominal adenopathy. Musculoskeletal: Degenerative changes in the spine. Review of the MIP images confirms the above findings. IMPRESSION: 1. Negative for pulmonary embolus. 2. Dependent atelectasis in both lower lobes. 3. Age advanced left anterior descending coronary artery calcification. 4.  Aortic atherosclerosis (ICD10-I70.0). 5. Enlarged pulmonic trunk, indicative of pulmonary arterial hypertension. Electronically Signed   By: Newell Eke M.D.   On: 11/20/2024 12:12  Scheduled Meds:  amLODipine   5 mg Oral Daily   aspirin  EC  81 mg Oral Daily   atorvastatin   40 mg Oral Daily   enoxaparin  (LOVENOX ) injection  40 mg Subcutaneous Q24H   fluticasone  furoate-vilanterol  1 puff Inhalation Daily   lidocaine  (PF)  2 mL     lisinopril   20 mg Oral Daily   methocarbamol   500 mg Oral TID   pantoprazole   40 mg Oral Daily   Continuous Infusions:  cefTRIAXone  (ROCEPHIN )  IV 2 g (11/20/24 1630)   metronidazole  500 mg (11/21/24 0819)   vancomycin  1,250 mg (11/21/24 0954)          Derryl Duval, MD [facility] 11/21/2024, 9:59 AM   "

## 2024-11-21 NOTE — Progress Notes (Signed)
 Patient Admitted for Sepsis or unknown origin, Inpatient Care Manager (ICM) conducted chart review to complete brief admission assessment.  Patient is independent and lives at home with spouse.   No discharge needs identified at this time. However, IPCM team will continue to follow along and monitor patient advancement through interdisciplinary progression rounds. If new patient transition needs arise, please enter a ICM consult to prompt IPCM team to follow up.    11/21/24 1310  TOC Brief Assessment  Insurance and Status Reviewed  Patient has primary care physician Yes  Home environment has been reviewed Home with Spouse  Prior level of function: Independent  Prior/Current Home Services No current home services  Social Drivers of Health Review SDOH reviewed no interventions necessary  Readmission risk has been reviewed Yes  Transition of care needs no transition of care needs at this time

## 2024-11-22 DIAGNOSIS — M542 Cervicalgia: Secondary | ICD-10-CM

## 2024-11-22 LAB — CBC WITH DIFFERENTIAL/PLATELET
Abs Immature Granulocytes: 0.09 K/uL — ABNORMAL HIGH (ref 0.00–0.07)
Basophils Absolute: 0.1 K/uL (ref 0.0–0.1)
Basophils Relative: 1 %
Eosinophils Absolute: 0.4 K/uL (ref 0.0–0.5)
Eosinophils Relative: 3 %
HCT: 36.9 % — ABNORMAL LOW (ref 39.0–52.0)
Hemoglobin: 12.6 g/dL — ABNORMAL LOW (ref 13.0–17.0)
Immature Granulocytes: 1 %
Lymphocytes Relative: 21 %
Lymphs Abs: 2.8 K/uL (ref 0.7–4.0)
MCH: 31.7 pg (ref 26.0–34.0)
MCHC: 34.1 g/dL (ref 30.0–36.0)
MCV: 92.9 fL (ref 80.0–100.0)
Monocytes Absolute: 1.7 K/uL — ABNORMAL HIGH (ref 0.1–1.0)
Monocytes Relative: 13 %
Neutro Abs: 8.1 K/uL — ABNORMAL HIGH (ref 1.7–7.7)
Neutrophils Relative %: 61 %
Platelets: 466 K/uL — ABNORMAL HIGH (ref 150–400)
RBC: 3.97 MIL/uL — ABNORMAL LOW (ref 4.22–5.81)
RDW: 13 % (ref 11.5–15.5)
WBC: 13.3 K/uL — ABNORMAL HIGH (ref 4.0–10.5)
nRBC: 0 % (ref 0.0–0.2)

## 2024-11-22 LAB — CSF CULTURE W GRAM STAIN
Culture: NO GROWTH
Gram Stain: NONE SEEN

## 2024-11-22 LAB — CREATININE, SERUM
Creatinine, Ser: 0.82 mg/dL (ref 0.61–1.24)
GFR, Estimated: >60 mL/min

## 2024-11-22 MED ORDER — HYDROCODONE-ACETAMINOPHEN 5-325 MG PO TABS: 1.0000 | ORAL_TABLET | Freq: Four times a day (QID) | ORAL | 0 refills | Status: AC | PRN

## 2024-11-22 MED ORDER — METHOCARBAMOL 500 MG PO TABS: 500.0000 mg | ORAL_TABLET | Freq: Four times a day (QID) | ORAL | 0 refills | Status: AC

## 2024-11-22 NOTE — Discharge Summary (Signed)
 Physician Discharge Summary  Barry Ross FMW:[id number] DOB: 09/18/1966 DOA: 11/19/2024  PCP: Toribio Jerel MATSU, MD  Admit date: 11/19/2024 Discharge date: 11/22/2024  Admitted From: Home Disposition: Home  Recommendations for Outpatient Follow-up:  Follow up with PCP in next few days for reevaluation.   Follow up in ED if symptoms worsen or new appear   Home Health: No Equipment/Devices: None  Discharge Condition: Stable CODE STATUS: Full Diet recommendation: Heart healthy  Brief/Interim Summary:  Barry Ross is a 59 y.o. 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  Patient presents with severe back pain under his left scapula that started 2 days PTA.  His pain has increasingly worsened.  He did come to the ER yesterday early morning for evaluation.  He was prescribed Valium , ibuprofen , morphine .  He took Valium  a couple times during the day without improvement.  By the end of the day, his pain has radiated up into his neck causing some difficulty moving his neck due to the pain.  On the day of admission, he went to his PCPs office for evaluation.  Due to his symptoms, he was advised to come to the ER for evaluation for meningitis.   Of note, the patient had flu over christmas and took Tamiflu.  His symptoms resolved within a few days.  He then had diarrhea and vomiting 3 to 4 days prior to presentation.    In the emergency department, the patient had an LP done with clear CSF.  CSF testing showed slightly elevated protein, but no leukocytes, elevated glucose, etc. that his panel was negative.  Culture eventually was negative for 48 hours.  MRI of neck and thoracic spine show some cervical degenerative disc disease, mild bulging disks throughout, but no cord or nerve impingement.  Blood work did show an elevated white count of 17.  He had a fever of 103 Fahrenheit in the ER.  On exam he was unable to move his  neck sideways, did not have any photophobia or other signs of meningitis.  Patient was initiated on broad-spectrum antibiotics due to concern for sepsis of unknown etiology. Extensive workup was unremarkable.  Blood culture x 2 negative.  CSF culture negative, COVID-19, influenza A, influenza B, RSV negative.  CT angiogram chest negative for acute findings.  It did show dilated pulmonary artery with possibility of pulmonary hypertension.  Patient did not have recurrence of fever in the hospital.  He was treated symptomatically with analgesics, muscle relaxers.  Antibiotics were discontinued when cultures returned negative.  He was discharged home in stable condition.  He is prescribed few days of oxycodone , Robaxin .  He need to follow-up closely with his PCP for any recurrence of fever or other signs of infection.  Of note his cortisol level was borderline low.  May get repeat test with primary care provider  Discharged home in stable condition.  Discharge Diagnoses:  Principal Problem:   Sepsis (HCC) Active Problems:   Obesity with body mass index 30 or greater   Hyperlipidemia   GERD (gastroesophageal reflux disease)   Acute neck pain    Discharge Instructions   Allergies as of 11/22/2024       Reactions   Other Other (See Comments)   IV IG hemoglobin, says he feels like his body is going to explode        Medication List     STOP taking these medications    morphine  30 MG tablet  Commonly known as: MSIR   naproxen 500 MG tablet Commonly known as: NAPROSYN       TAKE these medications    Allergy Eye 0.025-0.3 % ophthalmic solution Generic drug: naphazoline-pheniramine Place 1 drop into both eyes 3 (three) times daily as needed for eye irritation.   amLODipine  5 MG tablet Commonly known as: NORVASC  Take 5 mg by mouth daily.   aspirin  EC 81 MG tablet Take 81 mg by mouth daily.   atorvastatin  40 MG tablet Commonly known as: LIPITOR Take 40 mg by mouth  daily.   budesonide-formoterol 160-4.5 MCG/ACT inhaler Commonly known as: SYMBICORT Inhale 1-2 puffs into the lungs daily as needed (shortness of breath).   cholecalciferol 25 MCG (1000 UNIT) tablet Commonly known as: VITAMIN D3 Take 1,000 Units by mouth daily.   diazepam  5 MG tablet Commonly known as: VALIUM  Take 0.5-1 tablets (2.5-5 mg total) by mouth 2 (two) times daily.   Fish Oil 1200 MG Caps Take 1,200 mg by mouth daily.   HYDROcodone -acetaminophen  5-325 MG tablet Commonly known as: NORCO/VICODIN Take 1 tablet by mouth every 6 (six) hours as needed for up to 5 days for moderate pain (pain score 4-6) or severe pain (pain score 7-10).   ibuprofen  400 MG tablet Commonly known as: ADVIL  Take 1 tablet (400 mg total) by mouth 3 (three) times daily.   lisinopril  20 MG tablet Commonly known as: ZESTRIL  Take 20 mg by mouth daily.   methocarbamol  500 MG tablet Commonly known as: ROBAXIN  Take 1 tablet (500 mg total) by mouth 4 (four) times daily.   pantoprazole  40 MG tablet Commonly known as: Protonix  Take 1 tablet (40 mg total) by mouth daily.   sildenafil 20 MG tablet Commonly known as: REVATIO Take 20-100 mg by mouth daily as needed for erectile dysfunction.   triamcinolone cream 0.1 % Commonly known as: KENALOG Apply 1 application topically 2 (two) times daily as needed (itching).   vitamin C with rose hips 500 MG tablet Take 500 mg by mouth daily.        Follow-up Information     Toribio Jerel MATSU, MD. Schedule an appointment as soon as possible for a visit in 3 day(s).   Specialty: Family Medicine Contact information: [address] [place] [address] [phone]                Allergies[1]  Consultations:    Procedures/Studies: CT Angio Chest Pulmonary Embolism (PE) W or WO Contrast Result Date: 11/20/2024 CLINICAL DATA:  Severe back pain under left scapula. EXAM: CT ANGIOGRAPHY CHEST WITH CONTRAST TECHNIQUE: Multidetector CT imaging of the  chest was performed using the standard protocol during bolus administration of intravenous contrast. Multiplanar CT image reconstructions and MIPs were obtained to evaluate the vascular anatomy. RADIATION DOSE REDUCTION: This exam was performed according to the departmental dose-optimization program which includes automated exposure control, adjustment of the mA and/or kV according to patient size and/or use of iterative reconstruction technique. CONTRAST:  75mL OMNIPAQUE  IOHEXOL  350 MG/ML SOLN COMPARISON:  None Available. FINDINGS: Cardiovascular: Negative for pulmonary embolus. Atherosclerotic calcification of the aorta with age advanced involvement of the left anterior descending coronary artery. Enlarged pulmonic trunk and heart. No pericardial effusion. Mediastinum/Nodes: No pathologically enlarged mediastinal, hilar or axillary lymph nodes. Esophagus is grossly unremarkable. Lungs/Pleura: Image quality is degraded by expiratory phase imaging. Scattered subsegmental atelectasis with dependent atelectasis in the lower lobes. No pleural fluid. Airway is unremarkable. Upper Abdomen: Right hemidiaphragm is slightly elevated. Visualized portions of  the liver, adrenal glands, left kidney, spleen, pancreas, stomach and bowel are grossly unremarkable. No upper abdominal adenopathy. Musculoskeletal: Degenerative changes in the spine. Review of the MIP images confirms the above findings. IMPRESSION: 1. Negative for pulmonary embolus. 2. Dependent atelectasis in both lower lobes. 3. Age advanced left anterior descending coronary artery calcification. 4.  Aortic atherosclerosis (ICD10-I70.0). 5. Enlarged pulmonic trunk, indicative of pulmonary arterial hypertension. Electronically Signed   By: Newell Eke M.D.   On: 11/20/2024 12:12   MR THORACIC SPINE W WO CONTRAST Result Date: 11/19/2024 CLINICAL DATA:  Neck and back pain EXAM: MRI THORACIC WITHOUT AND WITH CONTRAST TECHNIQUE: Multiplanar and multiecho pulse  sequences of the thoracic spine were obtained without and with intravenous contrast. CONTRAST:  9mL GADAVIST  GADOBUTROL  1 MMOL/ML IV SOLN COMPARISON:  None Available. FINDINGS: Alignment: Normal Bone marrow signal: No significant abnormality Thoracic spinal cord: Normal Facet joints: No significant abnormality Intervertebral discs: There is a central disc herniation at L1-L2 without significant spinal stenosis. No thoracic disc herniation Paraspinal tissues: No significant abnormality IMPRESSION: 1. No significant abnormality in the thoracic spine 2. Central L1-L2 disc herniation without spinal stenosis Electronically Signed   By: Nancyann Burns M.D.   On: 11/19/2024 09:58   MR Cervical Spine W and Wo Contrast Result Date: 11/19/2024 CLINICAL DATA:  Neck and back pain EXAM: MRI CERVICAL SPINE WITHOUT AND WITH CONTRAST TECHNIQUE: Multiplanar and multiecho pulse sequences of the cervical spine, to include the craniocervical junction and cervicothoracic junction, were obtained without and with intravenous contrast. CONTRAST:  9mL GADAVIST  GADOBUTROL  1 MMOL/ML IV SOLN COMPARISON:  None Available. FINDINGS: The craniocervical junction is normal. There is no significant bone marrow signal abnormality. The cervical spinal cord is normal. C2-C3: Mild disc bulge, otherwise normal C3-C4: Mild disc bulge. Small foraminal spurs with mild bilateral neural foraminal stenosis. The facet joints are normal C4-C5: There is moderate degenerative disc disease. Bilateral foraminal spurs, larger on the left. There is moderate left neural foraminal stenosis. No significant facet disease C5-C6: There is severe degenerative disc disease with a mild disc bulge. Bilateral foraminal spurs with moderate bilateral neural foraminal stenosis. There is effacement of the thecal sac without compression of the cord, moderate spinal stenosis C6-C7: There is severe degenerative disc disease with a mild disc bulge. There is a right disc/foraminal spur  and a small left foraminal spur. Moderate right neural foraminal stenosis. Mild spinal stenosis C7-T1: The disc is normal. Mild left facet arthropathy. No spinal stenosis or foraminal stenosis IMPRESSION: Cervical spondylosis. There is moderate spinal stenosis at C5-6. Multilevel neural foraminal stenosis as outlined above. Electronically Signed   By: Nancyann Burns M.D.   On: 11/19/2024 09:55   DG Chest 2 View Result Date: 11/17/2024 EXAM: 2 VIEW(S) XRAY OF THE CHEST 11/17/2024 11:45:00 PM COMPARISON: None available. CLINICAL HISTORY: back pain in ribs FINDINGS: LUNGS AND PLEURA: Subsegmental atelectasis in lung bases. No pleural effusion. No pneumothorax. HEART AND MEDIASTINUM: No acute abnormality of the cardiac and mediastinal silhouettes. BONES AND SOFT TISSUES: No acute osseous abnormality. IMPRESSION: 1. No acute findings. Electronically signed by: Greig Pique MD MD 11/17/2024 11:47 PM EST RP Workstation: [id number]      Subjective:   Discharge Exam: Vitals:   11/21/24 2009 11/22/24 0439  BP: 128/72 (!) 142/86  Pulse: 72 60  Resp: 17 18  Temp: 98.6 F (37 C) 98.4 F (36.9 C)  SpO2: 98% 94%    General: Pt is alert, awake, not in acute distress HEENT: No spine  tenderness, no tenderness in the neck, patient able to move neck sideways today but not back to baseline yet Cardiovascular: rate controlled, S1/S2 + Respiratory: bilateral decreased breath sounds at bases Abdominal: Soft, NT, ND, bowel sounds + Extremities: no edema, no cyanosis    The results of significant diagnostics from this hospitalization (including imaging, microbiology, ancillary and laboratory) are listed below for reference.     Microbiology: Recent Results (from the past 240 hours)  CSF culture     Status: None   Collection Time: 11/19/24  2:33 PM   Specimen: Back; Cerebrospinal Fluid  Result Value Ref Range Status   Specimen Description   Final    BACK Performed at [facility], [address]  [place], [place] [address]    Special Requests   Final    NONE Performed at [facility], [address] [place], [place] [address]    Gram Stain   Final    NO ORGANISMS SEEN CSF CYTOSPIN SMEAR NO WBC SEEN Gram Stain Report Called to,Read Back By and Verified With: BRYANT S @ 1627 ON [id number]  BY HENDERSON L Performed at [facility], [address] [place], [place] [address]    Culture   Final    NO GROWTH 3 DAYS Performed at [facility], [address] [place], [place] [address]    Report Status 11/22/2024 FINAL  Final  Resp panel by RT-PCR (RSV, Flu A&B, Covid) Anterior Nasal Swab     Status: None   Collection Time: 11/19/24  4:49 PM   Specimen: Anterior Nasal Swab  Result Value Ref Range Status   SARS Coronavirus 2 by RT PCR NEGATIVE NEGATIVE Final    Comment: (NOTE) SARS-CoV-2 target nucleic acids are NOT DETECTED.  The SARS-CoV-2 RNA is generally detectable in upper respiratory specimens during the acute phase of infection. The lowest concentration of SARS-CoV-2 viral copies this assay can detect is 138 copies/mL. A negative result does not preclude SARS-Cov-2 infection and should not be used as the sole basis for treatment or other patient management decisions. A negative result may occur with  improper specimen collection/handling, submission of specimen other than nasopharyngeal swab, presence of viral mutation(s) within the areas targeted by this assay, and inadequate number of viral copies(<138 copies/mL). A negative result must be combined with clinical observations, patient history, and epidemiological information. The expected result is Negative.  Fact Sheet for Patients:  bloggercourse.com  Fact Sheet for Healthcare Providers:  seriousbroker.it  This test is no t yet approved or cleared by the [place]  FDA and  has been authorized for detection and/or diagnosis of SARS-CoV-2 by FDA under an Emergency  Use Authorization (EUA). This EUA will remain  in effect (meaning this test can be used) for the duration of the COVID-19 declaration under Section 564(b)(1) of the Act, 21 U.S.C.section 360bbb-3(b)(1), unless the authorization is terminated  or revoked sooner.       Influenza A by PCR NEGATIVE NEGATIVE Final   Influenza B by PCR NEGATIVE NEGATIVE Final    Comment: (NOTE) The Xpert Xpress SARS-CoV-2/FLU/RSV plus assay is intended as an aid in the diagnosis of influenza from Nasopharyngeal swab specimens and should not be used as a sole basis for treatment. Nasal washings and aspirates are unacceptable for Xpert Xpress SARS-CoV-2/FLU/RSV testing.  Fact Sheet for Patients: bloggercourse.com  Fact Sheet for Healthcare Providers: seriousbroker.it  This test is not yet approved or cleared by the [place]  FDA and has been authorized for detection and/or  diagnosis of SARS-CoV-2 by FDA under an Emergency Use Authorization (EUA). This EUA will remain in effect (meaning this test can be used) for the duration of the COVID-19 declaration under Section 564(b)(1) of the Act, 21 U.S.C. section 360bbb-3(b)(1), unless the authorization is terminated or revoked.     Resp Syncytial Virus by PCR NEGATIVE NEGATIVE Final    Comment: (NOTE) Fact Sheet for Patients: bloggercourse.com  Fact Sheet for Healthcare Providers: seriousbroker.it  This test is not yet approved or cleared by the [place]  FDA and has been authorized for detection and/or diagnosis of SARS-CoV-2 by FDA under an Emergency Use Authorization (EUA). This EUA will remain in effect (meaning this test can be used) for the duration of the COVID-19 declaration under Section 564(b)(1) of the Act, 21 U.S.C. section 360bbb-3(b)(1), unless the authorization is terminated or revoked.  Performed at [facility], [address], [place], [place] [address]   Blood culture (routine x 2)     Status: None (Preliminary result)   Collection Time: 11/19/24  5:20 PM   Specimen: BLOOD LEFT HAND  Result Value Ref Range Status   Specimen Description BLOOD LEFT HAND  Final   Special Requests   Final    BOTTLES DRAWN AEROBIC AND ANAEROBIC Blood Culture adequate volume   Culture   Final    NO GROWTH 3 DAYS Performed at [facility], [address], [place], [place] [address]    Report Status PENDING  Incomplete  Blood culture (routine x 2)     Status: None (Preliminary result)   Collection Time: 11/19/24  5:20 PM   Specimen: BLOOD LEFT HAND  Result Value Ref Range Status   Specimen Description BLOOD LEFT HAND  Final   Special Requests   Final    BOTTLES DRAWN AEROBIC AND ANAEROBIC Blood Culture adequate volume   Culture   Final    NO GROWTH 3 DAYS Performed at [facility], [address], [place], [place] [address]    Report Status PENDING  Incomplete     Labs: BNP (last 3 results) No results for input(s): BNP in the last 8760 hours. Basic Metabolic Panel: Recent Labs  Lab 11/18/24 0313 11/19/24 0837 11/20/24 0740 11/22/24 0455  NA 137 139 138  --   K 4.0 3.7 3.6  --   CL 100 101 100  --   CO2 23 23 30   --   GLUCOSE 98 97 94  --   BUN 14 8 10   --   CREATININE 1.02 0.89 0.89 0.82  CALCIUM  9.8 9.6 8.9  --    Liver Function Tests: Recent Labs  Lab 11/18/24 0313 11/19/24 1720  AST 24 22  ALT 23 20  ALKPHOS 82 74  BILITOT 0.6 0.9  PROT 7.6 7.6  ALBUMIN 4.5 4.4   No results for input(s): LIPASE, AMYLASE in the last 168 hours. No results for input(s): AMMONIA in the last 168 hours. CBC: Recent Labs  Lab 11/18/24 0313 11/19/24 0837 11/20/24 0740 11/21/24 0526 11/22/24 0455  WBC 15.5* 17.1* 18.2* 16.0* 13.3*  NEUTROABS 9.3* 11.1*  --  9.1* 8.1*  HGB 13.7 13.7 11.9* 11.8* 12.6*  HCT 38.6* 39.9 35.7* 34.7* 36.9*  MCV 90.4 92.1 94.2 93.5 92.9  PLT 470* 484* 432* 430* 466*   Cardiac  Enzymes: No results for input(s): CKTOTAL, CKMB, CKMBINDEX, TROPONINI in the last 168 hours. BNP: Invalid input(s): POCBNP CBG: No results for input(s): GLUCAP in the last 168 hours. D-Dimer No results for input(s): DDIMER  in the last 72 hours. Hgb A1c No results for input(s): HGBA1C in the last 72 hours. Lipid Profile No results for input(s): CHOL, HDL, LDLCALC, TRIG, CHOLHDL, LDLDIRECT in the last 72 hours. Thyroid function studies No results for input(s): TSH, T4TOTAL, T3FREE, THYROIDAB in the last 72 hours.  Invalid input(s): FREET3 Anemia work up No results for input(s): VITAMINB12, FOLATE, FERRITIN, TIBC, IRON, RETICCTPCT in the last 72 hours. Urinalysis No results found for: COLORURINE, APPEARANCEUR, LABSPEC, PHURINE, GLUCOSEU, HGBUR, BILIRUBINUR, KETONESUR, PROTEINUR, UROBILINOGEN, NITRITE, LEUKOCYTESUR Sepsis Labs Recent Labs  Lab 11/19/24 9162 11/20/24 0740 11/21/24 0526 11/22/24 0455  WBC 17.1* 18.2* 16.0* 13.3*   Microbiology Recent Results (from the past 240 hours)  CSF culture     Status: None   Collection Time: 11/19/24  2:33 PM   Specimen: Back; Cerebrospinal Fluid  Result Value Ref Range Status   Specimen Description   Final    BACK Performed at [facility], [address] [place], [place] [address]    Special Requests   Final    NONE Performed at [facility], [address] [place], [place] [address]    Gram Stain   Final    NO ORGANISMS SEEN CSF CYTOSPIN SMEAR NO WBC SEEN Gram Stain Report Called to,Read Back By and Verified With: BRYANT S @ 1627 ON [id number]  BY HENDERSON L Performed at [facility], [address], [place], [place] [address]    Culture   Final    NO GROWTH 3 DAYS Performed at [facility], [address] [place], [place] [address]    Report Status 11/22/2024 FINAL  Final  Resp panel by RT-PCR (RSV, Flu A&B, Covid) Anterior Nasal Swab     Status:  None   Collection Time: 11/19/24  4:49 PM   Specimen: Anterior Nasal Swab  Result Value Ref Range Status   SARS Coronavirus 2 by RT PCR NEGATIVE NEGATIVE Final    Comment: (NOTE) SARS-CoV-2 target nucleic acids are NOT DETECTED.  The SARS-CoV-2 RNA is generally detectable in upper respiratory specimens during the acute phase of infection. The lowest concentration of SARS-CoV-2 viral copies this assay can detect is 138 copies/mL. A negative result does not preclude SARS-Cov-2 infection and should not be used as the sole basis for treatment or other patient management decisions. A negative result may occur with  improper specimen collection/handling, submission of specimen other than nasopharyngeal swab, presence of viral mutation(s) within the areas targeted by this assay, and inadequate number of viral copies(<138 copies/mL). A negative result must be combined with clinical observations, patient history, and epidemiological information. The expected result is Negative.  Fact Sheet for Patients:  bloggercourse.com  Fact Sheet for Healthcare Providers:  seriousbroker.it  This test is no t yet approved or cleared by the [place]  FDA and  has been authorized for detection and/or diagnosis of SARS-CoV-2 by FDA under an Emergency Use Authorization (EUA). This EUA will remain  in effect (meaning this test can be used) for the duration of the COVID-19 declaration under Section 564(b)(1) of the Act, 21 U.S.C.section 360bbb-3(b)(1), unless the authorization is terminated  or revoked sooner.       Influenza A by PCR NEGATIVE NEGATIVE Final   Influenza B by PCR NEGATIVE NEGATIVE Final    Comment: (NOTE) The Xpert Xpress SARS-CoV-2/FLU/RSV plus assay is intended as an aid in the diagnosis of influenza from Nasopharyngeal swab specimens and should not be used as a sole basis for treatment. Nasal washings and aspirates  are unacceptable  for Xpert Xpress SARS-CoV-2/FLU/RSV testing.  Fact Sheet for Patients: bloggercourse.com  Fact Sheet for Healthcare Providers: seriousbroker.it  This test is not yet approved or cleared by the [place]  FDA and has been authorized for detection and/or diagnosis of SARS-CoV-2 by FDA under an Emergency Use Authorization (EUA). This EUA will remain in effect (meaning this test can be used) for the duration of the COVID-19 declaration under Section 564(b)(1) of the Act, 21 U.S.C. section 360bbb-3(b)(1), unless the authorization is terminated or revoked.     Resp Syncytial Virus by PCR NEGATIVE NEGATIVE Final    Comment: (NOTE) Fact Sheet for Patients: bloggercourse.com  Fact Sheet for Healthcare Providers: seriousbroker.it  This test is not yet approved or cleared by the [place]  FDA and has been authorized for detection and/or diagnosis of SARS-CoV-2 by FDA under an Emergency Use Authorization (EUA). This EUA will remain in effect (meaning this test can be used) for the duration of the COVID-19 declaration under Section 564(b)(1) of the Act, 21 U.S.C. section 360bbb-3(b)(1), unless the authorization is terminated or revoked.  Performed at [facility], [address] [place], [place] [address]   Blood culture (routine x 2)     Status: None (Preliminary result)   Collection Time: 11/19/24  5:20 PM   Specimen: BLOOD LEFT HAND  Result Value Ref Range Status   Specimen Description BLOOD LEFT HAND  Final   Special Requests   Final    BOTTLES DRAWN AEROBIC AND ANAEROBIC Blood Culture adequate volume   Culture   Final    NO GROWTH 3 DAYS Performed at [facility], [address], [place], [place] [address]    Report Status PENDING  Incomplete  Blood culture (routine x 2)     Status: None (Preliminary result)   Collection Time: 11/19/24  5:20 PM   Specimen: BLOOD LEFT HAND   Result Value Ref Range Status   Specimen Description BLOOD LEFT HAND  Final   Special Requests   Final    BOTTLES DRAWN AEROBIC AND ANAEROBIC Blood Culture adequate volume   Culture   Final    NO GROWTH 3 DAYS Performed at [facility], [address] [place], [place] [address]    Report Status PENDING  Incomplete     Time coordinating discharge: 35 minutes  SIGNED:   Derryl Duval, MD  [facility] 11/22/2024, 1:17 PM       [1]  Allergies Allergen Reactions   Other Other (See Comments)    IV IG hemoglobin, says he feels like his body is going to explode

## 2024-11-24 LAB — CULTURE, BLOOD (ROUTINE X 2)
Culture: NO GROWTH
Culture: NO GROWTH
Special Requests: ADEQUATE
Special Requests: ADEQUATE
